# Patient Record
Sex: Female | Born: 1985 | Race: White | Hispanic: No | Marital: Married | Smoking: Never smoker
Health system: Southern US, Community
[De-identification: ages and names within clinical notes are randomized; demographics above are authoritative.]

## PROBLEM LIST (undated history)

## (undated) ENCOUNTER — Inpatient Hospital Stay (HOSPITAL_COMMUNITY): Payer: Self-pay

## (undated) DIAGNOSIS — R7989 Other specified abnormal findings of blood chemistry: Secondary | ICD-10-CM

## (undated) DIAGNOSIS — I499 Cardiac arrhythmia, unspecified: Secondary | ICD-10-CM

## (undated) DIAGNOSIS — O365990 Maternal care for other known or suspected poor fetal growth, unspecified trimester, not applicable or unspecified: Secondary | ICD-10-CM

## (undated) DIAGNOSIS — K219 Gastro-esophageal reflux disease without esophagitis: Secondary | ICD-10-CM

## (undated) HISTORY — DX: Gastro-esophageal reflux disease without esophagitis: K21.9

## (undated) HISTORY — DX: Cardiac arrhythmia, unspecified: I49.9

## (undated) HISTORY — PX: THYROIDECTOMY, PARTIAL: SHX18

## (undated) HISTORY — DX: Other specified abnormal findings of blood chemistry: R79.89

---

## 2007-08-27 ENCOUNTER — Encounter: Admission: RE | Admit: 2007-08-27 | Discharge: 2007-08-27 | Payer: Self-pay | Admitting: Internal Medicine

## 2008-07-16 ENCOUNTER — Emergency Department (HOSPITAL_COMMUNITY): Admission: EM | Admit: 2008-07-16 | Discharge: 2008-07-16 | Payer: Self-pay | Admitting: Emergency Medicine

## 2009-06-15 ENCOUNTER — Encounter: Payer: Self-pay | Admitting: Cardiology

## 2009-06-15 ENCOUNTER — Ambulatory Visit (HOSPITAL_COMMUNITY): Admission: RE | Admit: 2009-06-15 | Discharge: 2009-06-15 | Payer: Self-pay | Admitting: Otolaryngology

## 2009-06-19 ENCOUNTER — Ambulatory Visit: Payer: Self-pay | Admitting: Cardiology

## 2009-06-19 DIAGNOSIS — R002 Palpitations: Secondary | ICD-10-CM

## 2009-06-19 DIAGNOSIS — R9431 Abnormal electrocardiogram [ECG] [EKG]: Secondary | ICD-10-CM

## 2009-06-19 DIAGNOSIS — F411 Generalized anxiety disorder: Secondary | ICD-10-CM

## 2009-06-21 ENCOUNTER — Ambulatory Visit (HOSPITAL_COMMUNITY): Admission: RE | Admit: 2009-06-21 | Discharge: 2009-06-21 | Payer: Self-pay | Admitting: Otolaryngology

## 2009-06-21 ENCOUNTER — Encounter (INDEPENDENT_AMBULATORY_CARE_PROVIDER_SITE_OTHER): Payer: Self-pay | Admitting: Diagnostic Radiology

## 2009-07-12 ENCOUNTER — Ambulatory Visit: Payer: Self-pay

## 2009-07-12 ENCOUNTER — Ambulatory Visit: Payer: Self-pay | Admitting: Cardiology

## 2009-07-12 ENCOUNTER — Encounter: Payer: Self-pay | Admitting: Cardiology

## 2009-07-12 ENCOUNTER — Ambulatory Visit (HOSPITAL_COMMUNITY): Admission: RE | Admit: 2009-07-12 | Discharge: 2009-07-12 | Payer: Self-pay | Admitting: Cardiology

## 2009-07-18 ENCOUNTER — Telehealth: Payer: Self-pay | Admitting: Cardiology

## 2009-07-30 ENCOUNTER — Encounter: Payer: Self-pay | Admitting: Cardiology

## 2009-11-28 ENCOUNTER — Other Ambulatory Visit: Admission: RE | Admit: 2009-11-28 | Discharge: 2009-11-28 | Payer: Self-pay | Admitting: Otolaryngology

## 2009-12-05 ENCOUNTER — Ambulatory Visit (HOSPITAL_COMMUNITY): Admission: RE | Admit: 2009-12-05 | Discharge: 2009-12-05 | Payer: Self-pay | Admitting: Otolaryngology

## 2010-01-03 ENCOUNTER — Ambulatory Visit (HOSPITAL_COMMUNITY): Admission: RE | Admit: 2010-01-03 | Discharge: 2010-01-04 | Payer: Self-pay | Admitting: Otolaryngology

## 2010-01-03 ENCOUNTER — Encounter (INDEPENDENT_AMBULATORY_CARE_PROVIDER_SITE_OTHER): Payer: Self-pay | Admitting: Otolaryngology

## 2010-08-11 NOTE — L&D Delivery Note (Signed)
Delivery Note   C/C/+1 at 2050 Onset of active 2nd stage 2100 FWB Cat 1-2 on EFM Patient pushed lithotomy than L and R lateral  At 9:23 PM a viable female was delivered via Vaginal, Spontaneous Delivery by CNM (Presentation: Middle Occiput Anterior, OA to ROT, L compound hand).  APGAR: 9, 9; weight 6-9.   Placenta status: Intact, Spontaneous.  Cord: 3 vessels with the following complications: None.  Cord pH: not done. Cord double clamped after pulse cessation, cut by FOB.   Anesthesia: Epidural  Episiotomy: None Lacerations: 1st degree;Vaginal Suture Repair: 3.0 vicryl rapide Est. Blood Loss (mL): 300  Mom to postpartum.  Baby to mother.  Melissa Stark 08/03/2011, 9:46 PM

## 2010-09-01 ENCOUNTER — Encounter: Payer: Self-pay | Admitting: Otolaryngology

## 2010-09-10 NOTE — Procedures (Signed)
Summary: summary report  summary report   Imported By: Mirna Mires 11/22/2009 12:06:26  _____________________________________________________________________  External Attachment:    Type:   Image     Comment:   External Document

## 2010-10-28 LAB — CBC
HCT: 37.3 % (ref 36.0–46.0)
MCHC: 34.9 g/dL (ref 30.0–36.0)
RBC: 3.9 MIL/uL (ref 3.87–5.11)

## 2010-12-30 LAB — HEPATITIS B SURFACE ANTIGEN: Hepatitis B Surface Ag: NEGATIVE

## 2010-12-30 LAB — RPR: RPR: NONREACTIVE

## 2011-07-25 ENCOUNTER — Encounter (HOSPITAL_COMMUNITY): Payer: Self-pay | Admitting: *Deleted

## 2011-07-25 ENCOUNTER — Telehealth (HOSPITAL_COMMUNITY): Payer: Self-pay | Admitting: *Deleted

## 2011-07-25 NOTE — Telephone Encounter (Signed)
Preadmission screen  

## 2011-08-02 ENCOUNTER — Encounter (HOSPITAL_COMMUNITY): Payer: Self-pay | Admitting: Pharmacist

## 2011-08-02 ENCOUNTER — Encounter (HOSPITAL_COMMUNITY): Payer: Self-pay

## 2011-08-02 ENCOUNTER — Inpatient Hospital Stay (HOSPITAL_COMMUNITY)
Admission: AD | Admit: 2011-08-02 | Discharge: 2011-08-05 | DRG: 775 | Disposition: A | Discharge status: Home / Self Care | Payer: 59 | Source: Ambulatory Visit | Attending: Obstetrics and Gynecology | Admitting: Obstetrics and Gynecology

## 2011-08-02 DIAGNOSIS — O365990 Maternal care for other known or suspected poor fetal growth, unspecified trimester, not applicable or unspecified: Principal | ICD-10-CM

## 2011-08-02 HISTORY — DX: Maternal care for other known or suspected poor fetal growth, unspecified trimester, not applicable or unspecified: O36.5990

## 2011-08-02 LAB — CBC
Hemoglobin: 11 g/dL — ABNORMAL LOW (ref 12.0–15.0)
RBC: 3.33 MIL/uL — ABNORMAL LOW (ref 3.87–5.11)

## 2011-08-02 MED ORDER — LIDOCAINE HCL (PF) 1 % IJ SOLN
30.0000 mL | INTRAMUSCULAR | Status: DC | PRN
  Filled 2011-08-02: qty 30

## 2011-08-02 MED ORDER — OXYTOCIN 10 UNIT/ML IJ SOLN: 10.0000 [IU] | Freq: Once | INTRAMUSCULAR | Status: DC

## 2011-08-02 MED ORDER — ONDANSETRON HCL 4 MG/2ML IJ SOLN
4.0000 mg | Freq: Four times a day (QID) | INTRAMUSCULAR | Status: DC | PRN
  Administered 2011-08-03: 4 mg via INTRAVENOUS
  Filled 2011-08-02: qty 2

## 2011-08-02 MED ORDER — CITRIC ACID-SODIUM CITRATE 334-500 MG/5ML PO SOLN: 30.0000 mL | ORAL | Status: DC | PRN

## 2011-08-02 MED ORDER — OXYCODONE-ACETAMINOPHEN 5-325 MG PO TABS: 2.0000 | ORAL_TABLET | ORAL | Status: DC | PRN

## 2011-08-02 MED ORDER — OXYTOCIN 20 UNITS IN LACTATED RINGERS INFUSION - SIMPLE
1.0000 m[IU]/min | INTRAVENOUS | Status: DC
  Administered 2011-08-03: 2 m[IU]/min via INTRAVENOUS
  Filled 2011-08-02: qty 1000

## 2011-08-02 MED ORDER — LACTATED RINGERS IV SOLN
500.0000 mL | INTRAVENOUS | Status: DC | PRN
  Administered 2011-08-03 (×2): 500 mL via INTRAVENOUS

## 2011-08-02 MED ORDER — ACETAMINOPHEN 325 MG PO TABS: 650.0000 mg | ORAL_TABLET | ORAL | Status: DC | PRN

## 2011-08-02 MED ORDER — NALBUPHINE SYRINGE 5 MG/0.5 ML
5.0000 mg | INJECTION | INTRAMUSCULAR | Status: DC | PRN
  Administered 2011-08-03: 5 mg via INTRAVENOUS
  Filled 2011-08-02: qty 1

## 2011-08-02 MED ORDER — OXYTOCIN 20 UNITS IN LACTATED RINGERS INFUSION - SIMPLE: 125.0000 mL/h | Freq: Once | INTRAVENOUS | Status: DC

## 2011-08-02 MED ORDER — FLEET ENEMA 7-19 GM/118ML RE ENEM: 1.0000 | ENEMA | RECTAL | Status: DC | PRN

## 2011-08-02 MED ORDER — LACTATED RINGERS IV SOLN
INTRAVENOUS | Status: DC
  Administered 2011-08-03 (×2): via INTRAVENOUS

## 2011-08-02 MED ORDER — DINOPROSTONE 10 MG VA INST
10.0000 mg | VAGINAL_INSERT | Freq: Once | VAGINAL | Status: AC
  Administered 2011-08-02: 10 mg via VAGINAL
  Filled 2011-08-02: qty 1

## 2011-08-02 MED ORDER — FAMOTIDINE 20 MG PO TABS
20.0000 mg | ORAL_TABLET | Freq: Every day | ORAL | Status: DC
  Filled 2011-08-02: qty 1

## 2011-08-02 MED ORDER — IBUPROFEN 600 MG PO TABS: 600.0000 mg | ORAL_TABLET | Freq: Four times a day (QID) | ORAL | Status: DC | PRN

## 2011-08-02 MED ORDER — OXYTOCIN BOLUS FROM INFUSION
500.0000 mL | Freq: Once | INTRAVENOUS | Status: AC
  Administered 2011-08-03: 500 mL via INTRAVENOUS
  Filled 2011-08-02: qty 500

## 2011-08-02 MED ORDER — ZOLPIDEM TARTRATE 10 MG PO TABS
10.0000 mg | ORAL_TABLET | Freq: Every evening | ORAL | Status: DC | PRN
  Administered 2011-08-02: 10 mg via ORAL
  Filled 2011-08-02: qty 1

## 2011-08-02 MED ORDER — TERBUTALINE SULFATE 1 MG/ML IJ SOLN: 0.2500 mg | Freq: Once | INTRAMUSCULAR | Status: AC | PRN

## 2011-08-02 NOTE — H&P (Signed)
  Melissa Stark is a 25 y.o. G1P0 at [redacted]w[redacted]d presenting for IOL 2/ developing asymmetric IUGR diagnosed at 37 wks, stable AT bi-weekly since w/ nl AF and reactive NST's. Phone consult w/ Dr. Sherrie George (MFM) last week, recc IOL at 39 wks.  Pt notes no contractions since membrane strip yesterday . Good fetal movement, No vaginal bleeding, no leaking fluid noted.  Prenatal Course Source of Care: [facility], midwife patient  with onset of care at 7 weeks Pregnancy complications or risk:  developing asymmetric IUGR,  Rh negative status (rhogam administered at 28 wks) Hx hemi-thyroidectomy (folicular adenoma), nl TFT's during pregnancy  Current medications: Zantac, PNV, red raspberry leaf tea, blue cohosh, evening primrose oil, cal-mag supplements  OB History    Grav Para Term Preterm Abortions TAB SAB Ect Mult Living   1              Past Medical History  Diagnosis Date  . Dysrhythmia     hx of tachycardia  . developing asymmetric IUGR - for IOL 08/02/2011   Past Surgical History  Procedure Date  . Thyroidectomy, partial    Family History: family history includes COPD in her paternal grandfather; Cancer in her mother; Diabetes in her maternal grandfather and paternal grandfather; Heart disease in her maternal grandfather and paternal grandfather; Hypertension in her paternal grandfather and paternal grandmother; Kidney disease in her maternal grandfather; and Peripheral vascular disease in her paternal grandmother. Social History:  reports that she has never smoked. She has never used smokeless tobacco. She reports that she does not drink alcohol or use illicit drugs.  Review of Systems - negative   Physical Exam: Blood pressure 123/77, pulse 105, temperature 97.3 F (36.3 C), temperature source Oral, resp. rate 18, height 5\' 9"  (1.753 m), weight 87.091 kg (192 lb). General: NAD Heart: RRR, no murmurs Lungs: CTA b/l  Abd: Soft, NT, EFW 6lbs  Ext: no edema Neuro: DTRs normal   Membranes:  intact Vaginal bleeding: none Speculum Exam: n/a Digital Cervical Exam: 2/60/-1  Presentation vertex FHR:  Baseline rate 130   Variability mod  Accelerations present  Decelerations none Contractions: Frequency occasional / mild  Pertinent Labs/Studies:  CBC and RPR pending  Prenatal labs: ABO, Rh: O/Negative/-- (05/21 0000) Antibody: Negative (05/21 0000) Rubella:  immune RPR: Nonreactive (05/21 0000)  HBsAg: Negative (05/21 0000)  HIV: Non-reactive (05/21 0000)  GBS: Negative (11/30 0000)  1 hr Glucola 126  Genetic screening wnl Ultrasound: Weeks 37 Result EFW 5-10 (20%) with AC at 0%, nl AFI, nl dopplers  Assessment: 25 y.o. G1P0 at [redacted]w[redacted]d, developing asymmetric IUGR  1. Term IUP, favorable Bishop's 2. FWB cat 1  Plan:  1. Admit to BS for cervidil IOL, Pitocin in AM 2. Routine L&D orders 3. Analgesia/anesthesia PRN     Consultant: Dr. Billy Coast updated    [facility] 08/02/2011, 9:05 PM

## 2011-08-03 ENCOUNTER — Inpatient Hospital Stay (HOSPITAL_COMMUNITY): Admission: RE | Admit: 2011-08-03 | Payer: Self-pay | Source: Ambulatory Visit

## 2011-08-03 ENCOUNTER — Encounter (HOSPITAL_COMMUNITY): Payer: Self-pay

## 2011-08-03 ENCOUNTER — Inpatient Hospital Stay (HOSPITAL_COMMUNITY): Payer: 59 | Admitting: Anesthesiology

## 2011-08-03 ENCOUNTER — Other Ambulatory Visit: Payer: Self-pay | Admitting: Obstetrics and Gynecology

## 2011-08-03 ENCOUNTER — Encounter (HOSPITAL_COMMUNITY): Payer: Self-pay | Admitting: Anesthesiology

## 2011-08-03 LAB — RPR: RPR Ser Ql: NONREACTIVE

## 2011-08-03 MED ORDER — WITCH HAZEL-GLYCERIN EX PADS: 1.0000 "application " | MEDICATED_PAD | CUTANEOUS | Status: DC | PRN

## 2011-08-03 MED ORDER — BENZOCAINE-MENTHOL 20-0.5 % EX AERO
1.0000 "application " | INHALATION_SPRAY | CUTANEOUS | Status: DC | PRN
  Administered 2011-08-04: 1 via TOPICAL

## 2011-08-03 MED ORDER — LACTATED RINGERS IV SOLN: 500.0000 mL | Freq: Once | INTRAVENOUS | Status: DC

## 2011-08-03 MED ORDER — EPHEDRINE 5 MG/ML INJ
10.0000 mg | INTRAVENOUS | Status: DC | PRN
  Filled 2011-08-03: qty 4

## 2011-08-03 MED ORDER — PHENYLEPHRINE 40 MCG/ML (10ML) SYRINGE FOR IV PUSH (FOR BLOOD PRESSURE SUPPORT): 80.0000 ug | PREFILLED_SYRINGE | INTRAVENOUS | Status: DC | PRN

## 2011-08-03 MED ORDER — EPHEDRINE 5 MG/ML INJ: 10.0000 mg | INTRAVENOUS | Status: DC | PRN

## 2011-08-03 MED ORDER — DIPHENHYDRAMINE HCL 25 MG PO CAPS: 25.0000 mg | ORAL_CAPSULE | Freq: Four times a day (QID) | ORAL | Status: DC | PRN

## 2011-08-03 MED ORDER — LANOLIN HYDROUS EX OINT: TOPICAL_OINTMENT | CUTANEOUS | Status: DC | PRN

## 2011-08-03 MED ORDER — PRENATAL MULTIVITAMIN CH
1.0000 | ORAL_TABLET | Freq: Every day | ORAL | Status: DC
  Administered 2011-08-04 – 2011-08-05 (×2): 1 via ORAL
  Filled 2011-08-03 (×2): qty 1

## 2011-08-03 MED ORDER — FLEET ENEMA 7-19 GM/118ML RE ENEM: 1.0000 | ENEMA | Freq: Every day | RECTAL | Status: DC | PRN

## 2011-08-03 MED ORDER — OXYCODONE-ACETAMINOPHEN 5-325 MG PO TABS
1.0000 | ORAL_TABLET | ORAL | Status: DC | PRN
  Administered 2011-08-04 – 2011-08-05 (×6): 1 via ORAL
  Filled 2011-08-03 (×6): qty 1

## 2011-08-03 MED ORDER — LIDOCAINE HCL 1.5 % IJ SOLN
INTRAMUSCULAR | Status: DC | PRN
  Administered 2011-08-03 (×2): 5 mL via EPIDURAL

## 2011-08-03 MED ORDER — BISACODYL 10 MG RE SUPP: 10.0000 mg | Freq: Every day | RECTAL | Status: DC | PRN

## 2011-08-03 MED ORDER — OXYTOCIN 20 UNITS IN LACTATED RINGERS INFUSION - SIMPLE: 1.0000 m[IU]/min | INTRAVENOUS | Status: DC

## 2011-08-03 MED ORDER — ZOLPIDEM TARTRATE 5 MG PO TABS: 5.0000 mg | ORAL_TABLET | Freq: Every evening | ORAL | Status: DC | PRN

## 2011-08-03 MED ORDER — IBUPROFEN 600 MG PO TABS
600.0000 mg | ORAL_TABLET | Freq: Four times a day (QID) | ORAL | Status: DC
  Administered 2011-08-04 – 2011-08-05 (×7): 600 mg via ORAL
  Filled 2011-08-03 (×7): qty 1

## 2011-08-03 MED ORDER — ONDANSETRON HCL 4 MG/2ML IJ SOLN: 4.0000 mg | INTRAMUSCULAR | Status: DC | PRN

## 2011-08-03 MED ORDER — SENNOSIDES-DOCUSATE SODIUM 8.6-50 MG PO TABS
2.0000 | ORAL_TABLET | Freq: Every day | ORAL | Status: DC
  Administered 2011-08-04: 2 via ORAL

## 2011-08-03 MED ORDER — ONDANSETRON HCL 4 MG PO TABS: 4.0000 mg | ORAL_TABLET | ORAL | Status: DC | PRN

## 2011-08-03 MED ORDER — DIBUCAINE 1 % RE OINT: 1.0000 "application " | TOPICAL_OINTMENT | RECTAL | Status: DC | PRN

## 2011-08-03 MED ORDER — FENTANYL 2.5 MCG/ML BUPIVACAINE 1/10 % EPIDURAL INFUSION (WH - ANES): 14.0000 mL/h | INTRAMUSCULAR | Status: DC

## 2011-08-03 MED ORDER — DIPHENHYDRAMINE HCL 50 MG/ML IJ SOLN: 12.5000 mg | INTRAMUSCULAR | Status: DC | PRN

## 2011-08-03 MED ORDER — TETANUS-DIPHTH-ACELL PERTUSSIS 5-2.5-18.5 LF-MCG/0.5 IM SUSP: 0.5000 mL | Freq: Once | INTRAMUSCULAR | Status: DC

## 2011-08-03 MED ORDER — FENTANYL 2.5 MCG/ML BUPIVACAINE 1/10 % EPIDURAL INFUSION (WH - ANES)
14.0000 mL/h | INTRAMUSCULAR | Status: DC
  Administered 2011-08-03: 14 mL/h via EPIDURAL
  Administered 2011-08-03: 12 mL/h via EPIDURAL
  Filled 2011-08-03 (×2): qty 60

## 2011-08-03 MED ORDER — PHENYLEPHRINE 40 MCG/ML (10ML) SYRINGE FOR IV PUSH (FOR BLOOD PRESSURE SUPPORT)
80.0000 ug | PREFILLED_SYRINGE | INTRAVENOUS | Status: DC | PRN
  Filled 2011-08-03: qty 5

## 2011-08-03 MED ORDER — LACTATED RINGERS IV BOLUS (SEPSIS): 1000.0000 mL | Freq: Once | INTRAVENOUS | Status: DC

## 2011-08-03 MED ORDER — SIMETHICONE 80 MG PO CHEW: 80.0000 mg | CHEWABLE_TABLET | ORAL | Status: DC | PRN

## 2011-08-03 NOTE — Anesthesia Procedure Notes (Signed)
Epidural Patient location during procedure: OB Start time: 08/03/2011 5:48 PM  Staffing Anesthesiologist: Brayton Caves R Performed by: anesthesiologist   Preanesthetic Checklist Completed: patient identified, site marked, surgical consent, pre-op evaluation, timeout performed, IV checked, risks and benefits discussed and monitors and equipment checked  Epidural Patient position: sitting Prep: site prepped and draped and DuraPrep Patient monitoring: continuous pulse ox and blood pressure Approach: midline Injection technique: LOR air and LOR saline  Needle:  Needle type: Tuohy  Needle gauge: 17 G Needle length: 9 cm Needle insertion depth: 5 cm cm Catheter type: closed end flexible Catheter size: 19 Gauge Catheter at skin depth: 10 cm Test dose: negative  Assessment Events: blood not aspirated, injection not painful, no injection resistance, negative IV test and no paresthesia  Additional Notes Patient identified.  Risk benefits discussed including failed block, incomplete pain control, headache, nerve damage, paralysis, blood pressure changes, nausea, vomiting, reactions to medication both toxic or allergic, and postpartum back pain.  Patient expressed understanding and wished to proceed.  All questions were answered.  Sterile technique used throughout procedure and epidural site dressed with sterile barrier dressing. No paresthesia or other complications noted.The patient did not experience any signs of intravascular injection such as tinnitus or metallic taste in mouth nor signs of intrathecal spread such as rapid motor block. Please see nursing notes for vital signs.

## 2011-08-03 NOTE — Progress Notes (Signed)
Reviewed Strip c D. Renae Fickle, CNM.  Discussed change in FHR BL & UA.

## 2011-08-03 NOTE — Progress Notes (Signed)
S: Doing well, pain well controlled with  Epidural, (+) pelvic pressure.   O: BP 125/88  Pulse 150  Temp(Src) 97.5 F (36.4 C) (Oral)  Resp 18  Ht 5\' 9"  (1.753 m)  Wt 87.091 kg (192 lb)  BMI 28.35 kg/m2  SpO2 100%   FHT:  FHR: 120 bpm, variability: moderate,  accelerations:  Present,  decelerations:  Present occasional variables UC:   regular, every 2-3 minutes SVE:   Dilation: 10 Effacement (%): 90 Station: +1 Exam by:: Colon Flattery, CNM   A / P: C/C, start active second stage, CNM at [facility] for support  Fetal Wellbeing:  Category I Pain Control:  Epidural  Anticipated MOD:  NSVD  Melissa Stark 08/03/2011, 8:55 PM

## 2011-08-03 NOTE — Progress Notes (Signed)
S: Doing well, pain minimal cramping, plan  Epidural for active labor. Pitocin at 10 mu/min   O: BP 124/83  Pulse 99  Temp(Src) 98.4 F (36.9 C) (Oral)  Resp 18  Ht 5\' 9"  (1.753 m)  Wt 87.091 kg (192 lb)  BMI 28.35 kg/m2   FHT:  FHR: 120 bpm, variability: moderate,  accelerations:  Present,  decelerations:  Absent UC:   regular, every 2-3 minutes SVE:   Dilation: 2 Effacement (%): 80 Station: -1 Exam by:: d. Renae Fickle CNM   AROM, clear AF  A / P: Induction of labor due to IUGR,  progressing well on pitocin  Fetal Wellbeing:  Category I Pain Control:  Labor support without medications and IV narcotic PRN, epidural in active labor  Anticipated MOD:  NSVD  Melissa Stark 08/03/2011, 12:12 PM

## 2011-08-03 NOTE — Progress Notes (Signed)
Colon Flattery, CNM stated she would like MVUs to be a minimum of 200, preferably 250-300

## 2011-08-03 NOTE — Progress Notes (Signed)
S: S/P epidural, pain relieved, feels mostly pressure, tired  O: BP 114/80  Pulse 107  Temp(Src) 98.2 F (36.8 C) (Oral)  Resp 18  Ht 5\' 9"  (1.753 m)  Wt 87.091 kg (192 lb)  BMI 28.35 kg/m2  SpO2 99%   FHT:  FHR: 120 bpm, variability: moderate,  accelerations:  Present,  decelerations:  Present mild variable x 1 with foley cath placement UC:   IUPC placed, ctx q 1-2 min, 40-50 mm pressure, lasting 50-60 sec SVE:   Dilation: 4-5 Effacement (%): 90 Station: -1 Exam by:: Melissa Stark, CNM   A / P: IOL at term, IUGR protracted latent phase with inadequate ctx. and dystotic pattern. Will decrease Pitocin by half to 10 mu/min and restart increasing by 2 mu q 30 min to reestablish adequate pattern LR bolus 1L Fetal Wellbeing:  Category I Pain Control:  Epidural  Anticipated MOD:  NSVD  Melissa Stark,Melissa Stark 08/03/2011, 6:52 PM

## 2011-08-03 NOTE — Progress Notes (Signed)
SVE's will be done by CNM.  May give Nubain without SVE if pt requests it for pain.  Arlan Organ, CNM will remain in-house.

## 2011-08-03 NOTE — Anesthesia Preprocedure Evaluation (Signed)
Anesthesia Evaluation  Patient identified by MRN, date of birth, ID band Patient awake    Reviewed: Allergy & Precautions, H&P , Patient's Chart, lab work & pertinent test results  Airway Mallampati: II TM Distance: >3 FB Neck ROM: full    Dental No notable dental hx.    Pulmonary neg pulmonary ROS,  clear to auscultation  Pulmonary exam normal       Cardiovascular neg cardio ROS + dysrhythmias regular Normal    Neuro/Psych Negative Neurological ROS  Negative Psych ROS   GI/Hepatic negative GI ROS, Neg liver ROS,   Endo/Other  Negative Endocrine ROS  Renal/GU negative Renal ROS     Musculoskeletal   Abdominal   Peds  Hematology negative hematology ROS (+)   Anesthesia Other Findings   Reproductive/Obstetrics (+) Pregnancy                           Anesthesia Physical Anesthesia Plan  ASA: II  Anesthesia Plan: Epidural   Post-op Pain Management:    Induction:   Airway Management Planned:   Additional Equipment:   Intra-op Plan:   Post-operative Plan:   Informed Consent: I have reviewed the patients History and Physical, chart, labs and discussed the procedure including the risks, benefits and alternatives for the proposed anesthesia with the patient or authorized representative who has indicated his/her understanding and acceptance.     Plan Discussed with:   Anesthesia Plan Comments:         Anesthesia Quick Evaluation

## 2011-08-03 NOTE — Progress Notes (Signed)
S: Breathing w/ ctx, s/p IV Nubain with minimal relief, labored in various pos - birthing ball, ambulated, now resting in bed, spouse and mother supportive at [facility]. Desires epidural now. Pitocin @ 18 mu/min  O: BP 125/79  Pulse 105  Temp(Src) 98.2 F (36.8 C) (Oral)  Resp 18  Ht 5\' 9"  (1.753 m)  Wt 87.091 kg (192 lb)  BMI 28.35 kg/m2   FHT:  FHR: 125 bpm, variability: moderate,  accelerations:  Present,  decelerations:  Absent UC:   regular, every 2-3 minutes SVE:   Dilation: 4 Effacement (%): 80 Station: -1 Exam by:: Arlan Organ, CNM   A / P: Protracted latent phase, starting to make noticeable change, expect active labor soon, will place IUPC after epidural effective.   Fetal Wellbeing:  Category I Pain Control:  Epidural now, continue on Pitocin  Anticipated MOD:  NSVD  Mariona Scholes 08/03/2011, 4:50 PM

## 2011-08-03 NOTE — Progress Notes (Signed)
Vomited 150cc of clear/white emesis -- BP WNL, denies feeling light-headed.

## 2011-08-03 NOTE — Progress Notes (Signed)
Melissa Stark is a 25 y.o. G1P0 at [redacted]w[redacted]d by ultrasound admitted for induction of labor due to developing asymmetric IUGR.  Subjective: Cervidil removed at 0630, patient slept during night with Ambien, showered this AM. Feels well. Good FM.   Objective: BP 123/77  Pulse 105  Temp(Src) 97.3 F (36.3 C) (Oral)  Resp 18  Ht 5\' 9"  (1.753 m)  Wt 87.091 kg (192 lb)  BMI 28.35 kg/m2      FHT:  FHR: 125 bpm, variability: moderate,  accelerations:  Present,  decelerations:  Absent UC:   irregular, every 3-6 minutes SVE:   Dilation: 2 Effacement (%): 80 Station: -1 Exam by:: d. Renae Fickle CNM  Membrane strip, (+) show on glove, vertex Labs: Lab Results  Component Value Date   WBC 12.0* 08/02/2011   HGB 11.0* 08/02/2011   HCT 31.8* 08/02/2011   MCV 95.5 08/02/2011   PLT 190 08/02/2011    Assessment / Plan: Induction of labor due to IUGR, s/p Cervidil overnight.  Labor: Plan start Pitocin ASAP Preeclampsia:  n/a Fetal Wellbeing:  Category I Pain Control:  Labor support without medications and IV narcotics PRN I/D:  GBS neg Anticipated MOD:  NSVD  Melissa Stark 08/03/2011, 8:16 AM

## 2011-08-04 ENCOUNTER — Encounter (HOSPITAL_COMMUNITY): Payer: Self-pay

## 2011-08-04 LAB — CBC
HCT: 30.1 % — ABNORMAL LOW (ref 36.0–46.0)
Hemoglobin: 10.3 g/dL — ABNORMAL LOW (ref 12.0–15.0)
MCHC: 34.2 g/dL (ref 30.0–36.0)
RDW: 12.5 % (ref 11.5–15.5)
WBC: 14.6 10*3/uL — ABNORMAL HIGH (ref 4.0–10.5)

## 2011-08-04 MED ORDER — BENZOCAINE-MENTHOL 20-0.5 % EX AERO
INHALATION_SPRAY | CUTANEOUS | Status: AC
  Filled 2011-08-04: qty 56

## 2011-08-04 MED ORDER — RHO D IMMUNE GLOBULIN 1500 UNIT/2ML IJ SOLN
300.0000 ug | Freq: Once | INTRAMUSCULAR | Status: AC
  Administered 2011-08-04: 300 ug via INTRAMUSCULAR
  Filled 2011-08-04: qty 2

## 2011-08-04 NOTE — Progress Notes (Signed)
PPD 1 SVD  S:  Reports feeling tired, infant breast fed through the night             Tolerating po/ No nausea or vomiting             Bleeding is light             Pain moderately controlled with Motrin and percocet.             Up ad lib / ambulatory / voiding  Newborn  Information for the patient's newborn:  Meggen, Spaziani [[id number]]  female  breast feeding    O:  A & O x 3 NAD, tired appearing             VS: Blood pressure 125/91, pulse 102, temperature 98.3 F (36.8 C), temperature source Oral, resp. rate 20, height 5\' 9"  (1.753 m), weight 87.091 kg (192 lb), SpO2 100.00%, unknown if currently breastfeeding.  LABS:  Basename 08/04/11 0520 08/02/11 2115  HGB 10.3* 11.0*  HCT 30.1* 31.8*    I&O: I/O last 3 completed shifts: In: -  Out: 700 [Urine:400; Blood:300]      Lungs: Clear and unlabored  Heart: regular rate and rhythm / no mumurs  Abdomen: soft, non-tender, non-distended              Fundus: firm, non-tender, U -2  Perineum: intact repair, no edema  Lochia: small  Extremities: no edema, no calf pain or tenderness, neg Homans    A/P: PPD # 1 25 y.o., G1P1001   Principal Problem:  *PP care, s/p SVD 12/24   Doing well - stable status  Routine post partum orders  Lactation consult today  Anticipate discharge home in AM.   PAUL,DANIELA, CNM, MSN 08/04/2011, 8:47 AM

## 2011-08-04 NOTE — Anesthesia Postprocedure Evaluation (Signed)
Anesthesia Post Note  Patient: Melissa Stark  Procedure(s) Performed: * No procedures listed *  Anesthesia type: Epidural  Patient location: Mother/Baby  Post pain: Pain level controlled  Post assessment: Post-op Vital signs reviewed  Last Vitals:  Filed Vitals:   08/04/11 0500  BP: 125/91  Pulse: 102  Temp: 36.8 C  Resp: 20    Post vital signs: Reviewed  Level of consciousness: awake  Complications: No apparent anesthesia complications

## 2011-08-05 LAB — RH IG WORKUP (INCLUDES ABO/RH)
ABO/RH(D): O NEG
Gestational Age(Wks): 39

## 2011-08-05 MED ORDER — IBUPROFEN 600 MG PO TABS: 600.0000 mg | ORAL_TABLET | Freq: Four times a day (QID) | ORAL | Status: AC

## 2011-08-05 MED ORDER — OXYCODONE-ACETAMINOPHEN 5-325 MG PO TABS: 1.0000 | ORAL_TABLET | ORAL | Status: AC | PRN

## 2011-08-05 NOTE — Progress Notes (Signed)
Post Partum Day #2            Information for the patient's newborn:  Floriene, Jeschke [[id number]]  female   Feeding: breast  Subjective: No HA, SOB, CP, F/C, breast symptoms. Pain cramping with feeds, on motrin and occasional percocet. Normal vaginal bleeding, no clots.      Objective:  Temp:  [98.1 F (36.7 C)-98.9 F (37.2 C)] 98.1 F (36.7 C) (12/25 0656) Pulse Rate:  [91-109] 91  (12/25 0656) Resp:  [18-20] 18  (12/25 0656) BP: (110-120)/(72-78) 117/78 mmHg (12/25 0656)  No intake or output data in the 24 hours ending 08/05/11 0813     Basename 08/04/11 0520 08/02/11 2115  WBC 14.6* 12.0*  HGB 10.3* 11.0*  HCT 30.1* 31.8*  PLT 174 190    Blood type: --/--/O NEG (12/24 1454)/ infant Rh positive Rubella: Immune (05/21 0000)    Physical Exam:  General: alert, cooperative and no distress Uterine Fundus: firm Lochia: appropriate Perineum: repair intact, edema none DVT Evaluation: No evidence of DVT seen on physical exam. No significant calf/ankle edema.    Assessment/Plan: PPD # 2 / 25 y.o., G1P1001 S/P:induced vaginal   Principal Problem:  *PP care, s/p SVD 12/24 Rh neg - Rhogam given   normal postpartum exam  Continue current postpartum care  D/C home   LOS: 3 days   PAUL,DANIELA, CNM, MSN 08/05/2011, 8:13 AM

## 2011-08-05 NOTE — Discharge Summary (Signed)
Obstetric Discharge Summary Reason for Admission: induction of labor, asymmetric IUGR, term pregnancy Prenatal Procedures: NST and ultrasound Intrapartum Procedures: spontaneous vaginal delivery Postpartum Procedures: Rho(D) Ig and TDaP vaccine Complications-Operative and Postpartum: 1st degree vaginal laceration Hemoglobin  Date Value Range Status  08/04/2011 10.3* 12.0-15.0 (g/dL) Final     HCT  Date Value Range Status  08/04/2011 30.1* 36.0-46.0 (%) Final    Discharge Diagnoses: Term Pregnancy-delivered  Discharge Information: Date: 08/05/2011 Activity: pelvic rest Diet: routine Medications: PNV, Ibuprofen and Percocet Condition: stable Instructions: refer to practice specific booklet Discharge to: home Follow-up Information    Follow up with PAUL,DANIELA, CNM in 6 weeks.   Contact information:   [address] [address] [phone]          Newborn Data: Live born female "Melissa Stark" Birth Weight: 6 lb 9.8 oz (3000 g) APGAR: 9, 9  Home with mother.  PAUL,DANIELA 08/05/2011, 8:18 AM

## 2014-01-24 LAB — OB RESULTS CONSOLE ABO/RH: RH TYPE: NEGATIVE

## 2014-01-24 LAB — OB RESULTS CONSOLE ANTIBODY SCREEN: Antibody Screen: NEGATIVE

## 2014-01-24 LAB — OB RESULTS CONSOLE RPR: RPR: NONREACTIVE

## 2014-01-24 LAB — OB RESULTS CONSOLE HIV ANTIBODY (ROUTINE TESTING): HIV: NONREACTIVE

## 2014-01-24 LAB — OB RESULTS CONSOLE HEPATITIS B SURFACE ANTIGEN: Hepatitis B Surface Ag: NEGATIVE

## 2014-02-13 LAB — OB RESULTS CONSOLE GC/CHLAMYDIA
CHLAMYDIA, DNA PROBE: NEGATIVE
Gonorrhea: NEGATIVE

## 2014-06-12 ENCOUNTER — Encounter (HOSPITAL_COMMUNITY): Payer: Self-pay

## 2014-08-02 LAB — OB RESULTS CONSOLE GBS: STREP GROUP B AG: NEGATIVE

## 2014-08-11 NOTE — L&D Delivery Note (Signed)
Delivery Note At 6:42 PM a viable and healthy female was delivered via  (Presentation:OA).  APGAR: 8, 9; weight  -pending  Placenta status: complete, spontaneous.  Cord:  with the following complications:-NONE .  Cord pH: n/a  Anesthesia:  Epidural  Episiotomy:  None  Lacerations:  None  Est. Blood Loss (mL):  300 cc  Mom to postpartum.  Baby to Couplet care / Skin to Skin.  Lashun Ramseyer R 08/31/2014, 7:08 PM

## 2014-08-18 ENCOUNTER — Encounter (HOSPITAL_COMMUNITY): Payer: Self-pay | Admitting: *Deleted

## 2014-08-18 ENCOUNTER — Telehealth (HOSPITAL_COMMUNITY): Payer: Self-pay | Admitting: *Deleted

## 2014-08-18 NOTE — Telephone Encounter (Signed)
Preadmission screen  

## 2014-08-21 ENCOUNTER — Observation Stay (HOSPITAL_COMMUNITY)
Admission: RE | Admit: 2014-08-21 | Discharge: 2014-08-21 | Disposition: A | Discharge status: Home / Self Care | Payer: 59 | Source: Ambulatory Visit | Attending: Obstetrics | Admitting: Obstetrics

## 2014-08-21 ENCOUNTER — Encounter (HOSPITAL_COMMUNITY): Payer: Self-pay

## 2014-08-21 DIAGNOSIS — Z3A38 38 weeks gestation of pregnancy: Secondary | ICD-10-CM | POA: Diagnosis not present

## 2014-08-21 DIAGNOSIS — O321XX0 Maternal care for breech presentation, not applicable or unspecified: Principal | ICD-10-CM | POA: Insufficient documentation

## 2014-08-21 DIAGNOSIS — K219 Gastro-esophageal reflux disease without esophagitis: Secondary | ICD-10-CM | POA: Insufficient documentation

## 2014-08-21 NOTE — H&P (Signed)
Melissa Stark is a  HPI: 29 yo G2P1 at 38'3 with prior vaginal delivery who was found to be complete breech at office visit last wk. Given prior SVD and favorable u/s (nl AFI, AGA, post placenta, complete breech) decision made for attempted version. Pt notes very active FM with frequent 'flipping', no VB, no LOF, no ctx.   [facility] at [facility] since 7 wks - dated by 7 wk u/s NCWD - h/o infertiilty- Clomid conception - increased N/V with need to Diclegis and Zofran throughout pregnancy - Rh neg, s/p Rhogam - h/o hemithyroidectomy, euthyroid w/o meds - b/l CPC, nl quad - marginal CI, nl growth - tachycardia, w/ episodes of SOB. S/p cards eval in past- benign tachycardia. Pt with stable bp and O2 sats throughout preg but tachycardia up to the 120's. Anxiety likely a factor. Pt declined cards referral adn SSRI - growth. 37'6. Complete breech, 7'0, 60%, AFI 16, post placenta   Prenatal Transfer Tool  Maternal Diabetes: No Genetic Screening: Normal Maternal Ultrasounds/Referrals: Normal Fetal Ultrasounds or other Referrals:  None Maternal Substance Abuse:  No Significant Maternal Medications:  None Significant Maternal Lab Results: None     OB History    Gravida Para Term Preterm AB TAB SAB Ectopic Multiple Living   2 1 1       1      Past Medical History  Diagnosis Date  . Dysrhythmia     hx of tachycardia  . 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 PP care, s/p SVD 12/24 08/04/2011  . GERD (gastroesophageal reflux disease)    Past Surgical History  Procedure Laterality Date  . Thyroidectomy, partial     Family History: family history includes COPD in her paternal grandfather; Cancer in her mother; Diabetes in her maternal grandfather and paternal grandfather; Heart disease in her maternal grandfather and paternal grandfather; Hypertension in her paternal grandfather and paternal grandmother; Kidney disease in her maternal grandfather; Peripheral vascular  disease in her paternal grandmother. Social History:  reports that she has never smoked. She has never used smokeless tobacco. She reports that she does not drink alcohol or use illicit drugs.  Review of Systems - Negative except active FM   Dilation: 2.5 Effacement (%): 20 Station: Ballotable Exam by:: Hodaya Curto Blood pressure 116/73, pulse 90, temperature 97.7 F (36.5 C), temperature source Oral, resp. rate 20, height 5\' 9"  (1.753 m), weight 90.719 kg (200 lb), last menstrual period 11/29/2013, unknown if currently breastfeeding.  Physical Exam:  Gen: well appearing, no distress  Back: no CVAT Abd: gravid, NT, no RUQ pain LE: no edema, equal bilaterally, non-tender Toco: rare FH: baseline 130s, accelerations present, no deceleratons, 10 beat variability  Prenatal labs: ABO, Rh: O/Negative/-- (06/16 0000) Antibody: Negative (06/16 0000) Rubella:   immune RPR: Nonreactive (06/16 0000)  HBsAg: Negative (06/16 0000)  HIV: Non-reactive (06/16 0000)  GBS: Negative (12/23 0000)  1 hr Glucola 127  Genetic screening nl quad Anatomy US b/l CPC cysts, marginal cord insertion   Assessment/Plan: 29 y.o. G2P1001 at 69110w3d With breech on prior u/s. For version today. Reactive fetal testing notes, cvx exam done, bedside u/s shows vtx presentation. Version cancelled, plan office f/u.    Decklyn Hyder A. 08/21/2014, 8:45 AM

## 2014-08-21 NOTE — Discharge Instructions (Signed)
Keep MD appt on Thursday unless signs/symptoms of labor or rupture of membranes prior to that date.

## 2014-08-25 ENCOUNTER — Other Ambulatory Visit: Payer: Self-pay | Admitting: Obstetrics

## 2014-08-30 ENCOUNTER — Other Ambulatory Visit: Payer: Self-pay | Admitting: Obstetrics & Gynecology

## 2014-08-31 ENCOUNTER — Encounter (HOSPITAL_COMMUNITY): Payer: Self-pay

## 2014-08-31 ENCOUNTER — Inpatient Hospital Stay (HOSPITAL_COMMUNITY): Payer: 59 | Admitting: Anesthesiology

## 2014-08-31 ENCOUNTER — Inpatient Hospital Stay (HOSPITAL_COMMUNITY)
Admission: RE | Admit: 2014-08-31 | Discharge: 2014-09-01 | DRG: 775 | Disposition: A | Discharge status: Home / Self Care | Payer: 59 | Source: Ambulatory Visit | Attending: Obstetrics & Gynecology | Admitting: Obstetrics & Gynecology

## 2014-08-31 DIAGNOSIS — D62 Acute posthemorrhagic anemia: Secondary | ICD-10-CM | POA: Diagnosis present

## 2014-08-31 DIAGNOSIS — Z3A39 39 weeks gestation of pregnancy: Secondary | ICD-10-CM | POA: Diagnosis present

## 2014-08-31 DIAGNOSIS — O9081 Anemia of the puerperium: Secondary | ICD-10-CM | POA: Diagnosis not present

## 2014-08-31 DIAGNOSIS — O320XX0 Maternal care for unstable lie, not applicable or unspecified: Secondary | ICD-10-CM | POA: Diagnosis present

## 2014-08-31 DIAGNOSIS — O26899 Other specified pregnancy related conditions, unspecified trimester: Secondary | ICD-10-CM | POA: Diagnosis present

## 2014-08-31 DIAGNOSIS — Z6791 Unspecified blood type, Rh negative: Secondary | ICD-10-CM

## 2014-08-31 LAB — CBC
HEMATOCRIT: 32 % — AB (ref 36.0–46.0)
Hemoglobin: 11.1 g/dL — ABNORMAL LOW (ref 12.0–15.0)
MCH: 32.3 pg (ref 26.0–34.0)
MCHC: 34.7 g/dL (ref 30.0–36.0)
MCV: 93 fL (ref 78.0–100.0)
PLATELETS: 229 10*3/uL (ref 150–400)
RBC: 3.44 MIL/uL — ABNORMAL LOW (ref 3.87–5.11)
RDW: 13.4 % (ref 11.5–15.5)
WBC: 8.4 10*3/uL (ref 4.0–10.5)

## 2014-08-31 MED ORDER — LIDOCAINE HCL (PF) 1 % IJ SOLN
30.0000 mL | INTRAMUSCULAR | Status: DC | PRN
  Filled 2014-08-31: qty 30

## 2014-08-31 MED ORDER — OXYCODONE-ACETAMINOPHEN 5-325 MG PO TABS: 1.0000 | ORAL_TABLET | ORAL | Status: DC | PRN

## 2014-08-31 MED ORDER — OXYCODONE-ACETAMINOPHEN 5-325 MG PO TABS
1.0000 | ORAL_TABLET | ORAL | Status: DC | PRN
  Administered 2014-09-01: 1 via ORAL
  Filled 2014-08-31: qty 1

## 2014-08-31 MED ORDER — FENTANYL 2.5 MCG/ML BUPIVACAINE 1/10 % EPIDURAL INFUSION (WH - ANES)
INTRAMUSCULAR | Status: AC
  Filled 2014-08-31: qty 125

## 2014-08-31 MED ORDER — EPHEDRINE 5 MG/ML INJ
10.0000 mg | INTRAVENOUS | Status: DC | PRN
  Filled 2014-08-31: qty 2

## 2014-08-31 MED ORDER — LIDOCAINE HCL (PF) 1 % IJ SOLN
INTRAMUSCULAR | Status: DC | PRN
  Administered 2014-08-31: 4 mL
  Administered 2014-08-31: 6 mL

## 2014-08-31 MED ORDER — IBUPROFEN 600 MG PO TABS
600.0000 mg | ORAL_TABLET | Freq: Four times a day (QID) | ORAL | Status: DC
  Administered 2014-09-01 (×2): 600 mg via ORAL
  Filled 2014-08-31 (×4): qty 1

## 2014-08-31 MED ORDER — CITRIC ACID-SODIUM CITRATE 334-500 MG/5ML PO SOLN: 30.0000 mL | ORAL | Status: DC | PRN

## 2014-08-31 MED ORDER — ZOLPIDEM TARTRATE 5 MG PO TABS: 5.0000 mg | ORAL_TABLET | Freq: Every evening | ORAL | Status: DC | PRN

## 2014-08-31 MED ORDER — ACETAMINOPHEN 325 MG PO TABS: 650.0000 mg | ORAL_TABLET | ORAL | Status: DC | PRN

## 2014-08-31 MED ORDER — DIPHENHYDRAMINE HCL 50 MG/ML IJ SOLN: 12.5000 mg | INTRAMUSCULAR | Status: DC | PRN

## 2014-08-31 MED ORDER — SIMETHICONE 80 MG PO CHEW: 80.0000 mg | CHEWABLE_TABLET | ORAL | Status: DC | PRN

## 2014-08-31 MED ORDER — DIPHENHYDRAMINE HCL 25 MG PO CAPS: 25.0000 mg | ORAL_CAPSULE | Freq: Four times a day (QID) | ORAL | Status: DC | PRN

## 2014-08-31 MED ORDER — FENTANYL 2.5 MCG/ML BUPIVACAINE 1/10 % EPIDURAL INFUSION (WH - ANES)
14.0000 mL/h | INTRAMUSCULAR | Status: DC | PRN
  Administered 2014-08-31: 14 mL/h via EPIDURAL

## 2014-08-31 MED ORDER — WITCH HAZEL-GLYCERIN EX PADS: 1.0000 "application " | MEDICATED_PAD | CUTANEOUS | Status: DC | PRN

## 2014-08-31 MED ORDER — FENTANYL 2.5 MCG/ML BUPIVACAINE 1/10 % EPIDURAL INFUSION (WH - ANES): 14.0000 mL/h | INTRAMUSCULAR | Status: DC | PRN

## 2014-08-31 MED ORDER — TERBUTALINE SULFATE 1 MG/ML IJ SOLN
0.2500 mg | Freq: Once | INTRAMUSCULAR | Status: DC | PRN
  Filled 2014-08-31: qty 1

## 2014-08-31 MED ORDER — LACTATED RINGERS IV SOLN: 500.0000 mL | INTRAVENOUS | Status: DC | PRN

## 2014-08-31 MED ORDER — DIBUCAINE 1 % RE OINT: 1.0000 "application " | TOPICAL_OINTMENT | RECTAL | Status: DC | PRN

## 2014-08-31 MED ORDER — PHENYLEPHRINE 40 MCG/ML (10ML) SYRINGE FOR IV PUSH (FOR BLOOD PRESSURE SUPPORT)
80.0000 ug | PREFILLED_SYRINGE | INTRAVENOUS | Status: DC | PRN
  Filled 2014-08-31: qty 2

## 2014-08-31 MED ORDER — BENZOCAINE-MENTHOL 20-0.5 % EX AERO: 1.0000 "application " | INHALATION_SPRAY | CUTANEOUS | Status: DC | PRN

## 2014-08-31 MED ORDER — BUTORPHANOL TARTRATE 1 MG/ML IJ SOLN: 1.0000 mg | INTRAMUSCULAR | Status: DC

## 2014-08-31 MED ORDER — LACTATED RINGERS IV SOLN
INTRAVENOUS | Status: DC
Start: 2014-08-31 — End: 2014-08-31
  Administered 2014-08-31: 14:00:00 via INTRAVENOUS

## 2014-08-31 MED ORDER — OXYCODONE-ACETAMINOPHEN 5-325 MG PO TABS: 2.0000 | ORAL_TABLET | ORAL | Status: DC | PRN

## 2014-08-31 MED ORDER — LANOLIN HYDROUS EX OINT: TOPICAL_OINTMENT | CUTANEOUS | Status: DC | PRN

## 2014-08-31 MED ORDER — SENNOSIDES-DOCUSATE SODIUM 8.6-50 MG PO TABS
2.0000 | ORAL_TABLET | ORAL | Status: DC
  Administered 2014-09-01: 2 via ORAL
  Filled 2014-08-31: qty 2

## 2014-08-31 MED ORDER — ONDANSETRON HCL 4 MG PO TABS: 4.0000 mg | ORAL_TABLET | ORAL | Status: DC | PRN

## 2014-08-31 MED ORDER — PRENATAL MULTIVITAMIN CH
1.0000 | ORAL_TABLET | Freq: Every day | ORAL | Status: DC
  Administered 2014-09-01: 1 via ORAL
  Filled 2014-08-31: qty 1

## 2014-08-31 MED ORDER — OXYTOCIN BOLUS FROM INFUSION
500.0000 mL | INTRAVENOUS | Status: DC
  Administered 2014-08-31: 500 mL via INTRAVENOUS

## 2014-08-31 MED ORDER — PHENYLEPHRINE 40 MCG/ML (10ML) SYRINGE FOR IV PUSH (FOR BLOOD PRESSURE SUPPORT)
PREFILLED_SYRINGE | INTRAVENOUS | Status: AC
  Filled 2014-08-31: qty 20

## 2014-08-31 MED ORDER — ONDANSETRON HCL 4 MG/2ML IJ SOLN
4.0000 mg | Freq: Four times a day (QID) | INTRAMUSCULAR | Status: DC | PRN
  Administered 2014-08-31: 4 mg via INTRAVENOUS
  Filled 2014-08-31: qty 2

## 2014-08-31 MED ORDER — OXYTOCIN 40 UNITS IN LACTATED RINGERS INFUSION - SIMPLE MED: 62.5000 mL/h | INTRAVENOUS | Status: DC

## 2014-08-31 MED ORDER — LACTATED RINGERS IV SOLN
500.0000 mL | Freq: Once | INTRAVENOUS | Status: AC
  Administered 2014-08-31: 500 mL via INTRAVENOUS

## 2014-08-31 MED ORDER — OXYTOCIN 40 UNITS IN LACTATED RINGERS INFUSION - SIMPLE MED
1.0000 m[IU]/min | INTRAVENOUS | Status: DC
  Administered 2014-08-31: 1 m[IU]/min via INTRAVENOUS
  Filled 2014-08-31: qty 1000

## 2014-08-31 MED ORDER — FLEET ENEMA 7-19 GM/118ML RE ENEM: 1.0000 | ENEMA | RECTAL | Status: DC | PRN

## 2014-08-31 MED ORDER — TETANUS-DIPHTH-ACELL PERTUSSIS 5-2.5-18.5 LF-MCG/0.5 IM SUSP: 0.5000 mL | Freq: Once | INTRAMUSCULAR | Status: DC

## 2014-08-31 MED ORDER — ONDANSETRON HCL 4 MG/2ML IJ SOLN: 4.0000 mg | INTRAMUSCULAR | Status: DC | PRN

## 2014-08-31 NOTE — Anesthesia Procedure Notes (Signed)

## 2014-08-31 NOTE — H&P (Addendum)
Melissa BeechamLauren N Stark is a 29 y.o. female G2P1001, at 39.6 wks presenting for IOL due to favorable cervix, unstable lie (vtx now) and travel concerns in bad weather. Denies pain/ bleeding/ leaking at admission and noted good FMs.  [facility] at [facility]. H/o partial thyroidectomy but normal TSH without medication. Stable tachycardia. Rh neg, s/p Rhogam.  Marginal cord insertion with good interval growth at 28 wks. Nausea/ vomiting entire pregnancy with Diclegis and Zofran use.  Breech presentation with spontaneous version at 37 wks (the day of version), prior IOL with SVD.   History OB History    Gravida Para Term Preterm AB TAB SAB Ectopic Multiple Living   2 1 1       1      Past Medical History  Diagnosis Date  . Dysrhythmia     hx of tachycardia  . 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 PP care, s/p SVD 12/24 08/04/2011  . GERD (gastroesophageal reflux disease)    Past Surgical History  Procedure Laterality Date  . Thyroidectomy, partial     Family History: family history includes COPD in her paternal grandfather; Cancer in her mother; Diabetes in her maternal grandfather and paternal grandfather; Heart disease in her maternal grandfather and paternal grandfather; Hypertension in her paternal grandfather and paternal grandmother; Kidney disease in her maternal grandfather; Peripheral vascular disease in her paternal grandmother. Social History:  reports that she has never smoked. She has never used smokeless tobacco. She reports that she does not drink alcohol or use illicit drugs.   Prenatal Transfer Tool  Maternal Diabetes: No Genetic Screening: Normal QUAD screen  Maternal Ultrasounds/Referrals: Normal, marginal cord with good interval growth at 28 wks, bilateral choroid plexus cysts, no other abnormality Fetal Ultrasounds or other Referrals:  None Maternal Substance Abuse:  No Significant Maternal Medications:  Diclegis and Zofran through pregncy for  nausea/vomit Significant Maternal Lab Results:  Lab values include: Group B Strep negative, Rh negative  Other Comments:  Partial thyroidectomy but euthyroid, no med.  ROS  Neg   Dilation: 3 Effacement (%): 50 Station: Ballotable Exam by:: Dr Kendra Grissett Blood pressure 114/82, pulse 98, temperature 98.2 F (36.8 C), temperature source Oral, resp. rate 20, height 5\' 3"  (1.6 m), weight 134 lb (60.782 kg), last menstrual period 11/29/2013, SpO2 98 %, unknown if currently breastfeeding. Exam Physical Exam  Physical exam:  A&O x 3, no acute distress. Pleasant HEENT neg, no thyromegaly Lungs CTA bilat CV RRR, A1S2 normal Abdo soft, non tender, non acute Extr no edema/ tenderness Pelvic above, controlled AROM, clear fluid, head well applied  FHT  145/ mod variab/ accels noted, no decels- category I Toco irreg with pitocin at 4 mu rate  Prenatal labs: ABO, Rh: O/Negative/-- (06/16 0000) Antibody: Negative (06/16 0000) Rubella:  Immune RPR: Nonreactive (06/16 0000)  HBsAg: Negative (06/16 0000)  HIV: Non-reactive (06/16 0000)  GBS: Negative (12/23 0000)  Glucola normal TSH normal QUAD screen normal  Assessment/Plan: 29 yo G2P1001 at 39.6 wks for IOL due to unstable lie, now VTX. Low dose pitocin. GBS neg. Epidural in active labor. EFW 7.1/2 lbs, anticipate SVD.    Esly Selvage R 08/31/2014, 2:47 PM

## 2014-08-31 NOTE — Progress Notes (Signed)
Melissa BeechamLauren N Stark is a 29 y.o. G2P1001 at 3326w6d, IOL due to favorable cx and pt request due to imminent poor weather.  S/p epidural. Feels better after vomiting.   Objective: BP 114/82 mmHg  Pulse 98  Temp(Src) 98.2 F (36.8 C) (Oral)  Resp 20  Ht 5\' 3"  (1.6 m)  Wt 134 lb (60.782 kg)  BMI 23.74 kg/m2  SpO2 98%  LMP 11/29/2013    FHT: category I, 120s bpm, variability: moderate,  accelerations:  Present,  decelerations:  Absent UC:   regular, every 2-3 minutes SVE:   Dilation: 5.5 Effacement (%): 90 Station: -2 Exam by:: SH RNC   Assessment / Plan: Induction of labor due to term with favorable cervix,  progressing well on pitocin  Labor: Progressing normally Preeclampsia:  none Fetal Wellbeing:  Category I Pain Control:  Epidural I/D:  n/a Anticipated MOD:  NSVD  Gwyn Hieronymus R 08/31/2014, 5:16 PM

## 2014-08-31 NOTE — Anesthesia Preprocedure Evaluation (Signed)
Anesthesia Evaluation  Patient identified by MRN, date of birth, ID band Patient awake    Reviewed: Allergy & Precautions, H&P , Patient's Chart, lab work & pertinent test results  Airway Mallampati: II  TM Distance: >3 FB Neck ROM: full    Dental  (+) Teeth Intact   Pulmonary  breath sounds clear to auscultation        Cardiovascular Rhythm:regular Rate:Normal     Neuro/Psych    GI/Hepatic   Endo/Other    Renal/GU      Musculoskeletal   Abdominal   Peds  Hematology   Anesthesia Other Findings Dysrhythmia......Marland Kitchen.hx of tachycardia; No Meds or Rx developing IUGR - for IOL     Reproductive/Obstetrics (+) Pregnancy                             Anesthesia Physical Anesthesia Plan  ASA: II  Anesthesia Plan: Epidural   Post-op Pain Management:    Induction:   Airway Management Planned:   Additional Equipment:   Intra-op Plan:   Post-operative Plan:   Informed Consent: I have reviewed the patients History and Physical, chart, labs and discussed the procedure including the risks, benefits and alternatives for the proposed anesthesia with the patient or authorized representative who has indicated his/her understanding and acceptance.   Dental Advisory Given  Plan Discussed with:   Anesthesia Plan Comments: (Labs checked- platelets confirmed with RN in room. Fetal heart tracing, per RN, reported to be stable enough for sitting procedure. Discussed epidural, and patient consents to the procedure:  included risk of possible headache,backache, failed block, allergic reaction, and nerve injury. This patient was asked if she had any questions or concerns before the procedure started.)        Anesthesia Quick Evaluation

## 2014-08-31 NOTE — Progress Notes (Signed)
Patient ID: Melissa BeechamLauren N Kobayashi, female   DOB: 09/29/1985, 29 y.o.   MRN: [id number]   Subjective: Melissa Stark is a 29 y.o. G2P1001 at 7052w6d by ultrasound admitted for induction of labor due to favorable cervix and environmental constraints.  Feeling fine, sitting up in bed on cell phone.  Complaints of mild menstrual-like cramps.  Objective: Filed Vitals:   08/31/14 0810 08/31/14 0935  BP: 108/71 116/72  Pulse: 102 99  Temp: 98.2 F (36.8 C)   TempSrc: Oral   Resp: 20   SpO2: 99%         FHT:  FHR: 140 bpm, variability: moderate,  accelerations:  Present,  decelerations:  Absent UC:   irregular, every 3-5 minutes SVE:   Dilation: 3 Effacement (%): 70 Station: Ballotable Exam by:: [employer]xon Mobil CorporationSandra Holleman RNC  Labs:   Recent Labs  08/31/14 0755  WBC 8.4  HGB 11.1*  HCT 32.0*  PLT 229    Assessment / Plan: Induction of labor due to favorable cervix,  progressing well on pitocin  Labor: Progressing normally Preeclampsia:  N/A Fetal Wellbeing:  Category I Pain Control:  Labor support without medications I/D:  n/a Anticipated MOD:  NSVD  Kenard GowerAWSON, Clarita Mcelvain, M, MSN, CNM 08/31/2014, 11:10 AM

## 2014-09-01 ENCOUNTER — Encounter (HOSPITAL_COMMUNITY): Payer: Self-pay

## 2014-09-01 DIAGNOSIS — D62 Acute posthemorrhagic anemia: Secondary | ICD-10-CM | POA: Diagnosis not present

## 2014-09-01 DIAGNOSIS — O26899 Other specified pregnancy related conditions, unspecified trimester: Secondary | ICD-10-CM | POA: Diagnosis present

## 2014-09-01 DIAGNOSIS — Z6791 Unspecified blood type, Rh negative: Secondary | ICD-10-CM | POA: Diagnosis present

## 2014-09-01 LAB — CBC
HEMATOCRIT: 28.7 % — AB (ref 36.0–46.0)
Hemoglobin: 9.7 g/dL — ABNORMAL LOW (ref 12.0–15.0)
MCH: 31.9 pg (ref 26.0–34.0)
MCHC: 33.8 g/dL (ref 30.0–36.0)
MCV: 94.4 fL (ref 78.0–100.0)
Platelets: 204 10*3/uL (ref 150–400)
RBC: 3.04 MIL/uL — AB (ref 3.87–5.11)
RDW: 13.5 % (ref 11.5–15.5)
WBC: 10.5 10*3/uL (ref 4.0–10.5)

## 2014-09-01 LAB — RPR: RPR: NONREACTIVE

## 2014-09-01 MED ORDER — POLYSACCHARIDE IRON COMPLEX 150 MG PO CAPS
150.0000 mg | ORAL_CAPSULE | Freq: Two times a day (BID) | ORAL | Status: DC
  Administered 2014-09-01: 150 mg via ORAL
  Filled 2014-09-01: qty 1

## 2014-09-01 MED ORDER — RHO D IMMUNE GLOBULIN 1500 UNIT/2ML IJ SOSY
300.0000 ug | PREFILLED_SYRINGE | Freq: Once | INTRAMUSCULAR | Status: AC
  Administered 2014-09-01: 300 ug via INTRAVENOUS
  Filled 2014-09-01: qty 2

## 2014-09-01 MED ORDER — IBUPROFEN 600 MG PO TABS: 600.0000 mg | ORAL_TABLET | Freq: Four times a day (QID) | ORAL | Status: DC

## 2014-09-01 MED ORDER — MAGNESIUM OXIDE 400 (241.3 MG) MG PO TABS: 200.0000 mg | ORAL_TABLET | Freq: Every day | ORAL | Status: DC

## 2014-09-01 MED ORDER — MAGNESIUM OXIDE 400 (241.3 MG) MG PO TABS
200.0000 mg | ORAL_TABLET | Freq: Every day | ORAL | Status: DC
  Filled 2014-09-01 (×2): qty 0.5

## 2014-09-01 MED ORDER — POLYSACCHARIDE IRON COMPLEX 150 MG PO CAPS: 150.0000 mg | ORAL_CAPSULE | Freq: Two times a day (BID) | ORAL | Status: DC

## 2014-09-01 NOTE — Anesthesia Postprocedure Evaluation (Signed)
Anesthesia Post Note  Patient: Heloise BeechamLauren N Dunaj  Procedure(s) Performed: * No procedures listed *  Anesthesia type: Epidural  Patient location: Mother/Baby  Post pain: Pain level controlled  Post assessment: Post-op Vital signs reviewed  Last Vitals:  Filed Vitals:   09/01/14 0140  BP: 122/78  Pulse: 82  Temp: 36.9 C  Resp: 18    Post vital signs: Reviewed  Level of consciousness:alert  Complications: No apparent anesthesia complications

## 2014-09-01 NOTE — Progress Notes (Signed)
[name] was referred for history of depression/anxiety.  Referral is screened out by Clinical Social Worker because none of the following criteria appear to apply: -History of anxiety/depression during this pregnancy, or of post-partum depression. - Diagnosis of anxiety and/or depression within last 3 years - History of depression due to pregnancy loss/loss of child or -[name]'s symptoms are currently being treated with medication and/or therapy.  CSW completed chart review. In one prenatal record, she reported feeling anxious during one visit due to appropriate concern about the baby; however, it was not documented as an active/ongoing problem.  Additionally, [name] does not have anxiety listed as an active problem during this pregnancy or during her pregnancy in 2012.  Please contact the Clinical Social Worker if needs arise or upon [name] request.   Melissa BooksSarah Annie Stark MSW, LCSW Clinical Social Worker [phone]

## 2014-09-01 NOTE — Lactation Note (Signed)
This note was copied from the chart of Melissa Stark Casique. Lactation Consultation Note  Patient Name: Melissa Stark Feider ZOXWR'UToday's Date: 09/01/2014 Reason for consult: Initial assessment Baby 18 hours of life. Mom reports that baby is sleepy and not wanting to latch well. Assisted mom to latch baby in football position. Mom reports that her wrists are hurting, so demonstrated to mom how to support her hand with a blanket roll once baby latched. Attempted to latch baby several times, baby licking breasts and disorganized. LC let baby suck on LC's gloved finger, and after a few minutes the baby began to suck rhythmically. Mom able to hand express colostrum and enc baby to open mouth widely, baby latched deeply with lips flanged and suckled rhythmically with a few swallows noted.   Enc mom to offer breasts with cues and at least 8-12 times/24 hours. Discussed waking techniques and enc STS. [facility], aware of OP/BFS[facility], community resources, and [facility] phone line assistance after D/C.   Mom states that she has two personal DEBPs at home. Reviewed nursing/pumping and returning to work.   Maternal Data Has patient been taught Hand Expression?: Yes Does the patient have breastfeeding experience prior to this delivery?: Yes  Feeding Feeding Type: Breast Fed Length of feed:  (LC assessed first 20 minutes of BF.)  LATCH Score/Interventions Latch: Repeated attempts needed to sustain latch, nipple held in mouth throughout feeding, stimulation needed to elicit sucking reflex. (Baby sleepy, demonstrated how to stimulate baby to keep nursing.) Intervention(s): Adjust position;Assist with latch;Breast compression  Audible Swallowing: A few with stimulation Intervention(s): Hand expression  Type of Nipple: Flat (Short shaft, but everts with stimulation of baby nursing.)  Comfort (Breast/Nipple): Soft / non-tender     Hold (Positioning): Assistance needed to correctly position infant at breast  and maintain latch. Intervention(s): Breastfeeding basics reviewed;Support Pillows;Position options  LATCH Score: 6  Lactation Tools Discussed/Used     Consult Status Consult Status: Follow-up Date: 09/02/14 Follow-up type: In-patient    Geralynn OchsWILLIARD, Laportia Carley 09/01/2014, 12:45 PM

## 2014-09-01 NOTE — Discharge Summary (Signed)
Obstetric Discharge Summary Reason for Admission: induction of labor Prenatal Course: complicated by hx partial thyroidectomy-stable TSH, marginal CI, HEG, Rh negative-Rhogam given, and stable tachycardia. Intrapartum Procedures: spontaneous vaginal delivery Postpartum Procedures: Rho(D) Ig Complications-Operative and Postpartum: ABL anemia HEMOGLOBIN  Date Value Ref Range Status  09/01/2014 9.7* 12.0 - 15.0 g/dL Final   HCT  Date Value Ref Range Status  09/01/2014 28.7* 36.0 - 46.0 % Final    Physical Exam:  General: alert and cooperative Lochia: appropriate Uterine Fundus: firm DVT Evaluation: No evidence of DVT seen on physical exam. Negative Homan's sign. No cords or calf tenderness. No significant calf/ankle edema.  Discharge Diagnoses: Term Pregnancy-delivered , ABL anemia Discharge Information: Date: 09/01/2014 Activity: pelvic rest Diet: routine Medications: PNV, Ibuprofen, Iron and Mag oxide Condition: stable Instructions: refer to practice specific booklet Discharge to: home Follow-up Information    [facility]. Schedule an appointment as soon as possible for a visit in 6 weeks.   Specialty:  Obstetrics and Gynecology   Contact information:   [address] Severe[address] [place] [id number] [phone]       Newborn Data: Live born female on 08/31/14 Birth Weight: 7 lb 7.6 oz (3390 g) APGAR: 8, 9  Home with mother.  Melissa Stark, N 09/01/2014, 6:40 PM

## 2014-09-01 NOTE — Progress Notes (Signed)
PPD #1- SVD  Subjective:   Reports feeling well, desires early discharge  Tolerating po/ No nausea or vomiting Bleeding is light Pain controlled with Motrin and Percocet Up ad lib / ambulatory / voiding without problems Newborn: breastfeeding     Objective:   VS:  VS:  Filed Vitals:   08/31/14 2000 08/31/14 2035 08/31/14 2145 09/01/14 0140  BP: 114/76 127/81 116/67 122/78  Pulse: 109 115 114 82  Temp:  98.6 F (37 C) 98.6 F (37 C) 98.4 F (36.9 C)  TempSrc:  Oral Oral Oral  Resp:  18 18 18   Height:      Weight:      SpO2:  97% 97% 98%    LABS:  Recent Labs  08/31/14 0755 09/01/14 0550  WBC 8.4 10.5  HGB 11.1* 9.7*  PLT 229 204   Blood type: O/Negative/-- (06/16 0000) Rubella:     I&O: Intake/Output    None     Physical Exam: Alert and oriented x3 Abdomen: soft, non-tender, non-distended  Fundus: firm, non-tender, U-2 Perineum: intact, no edema or erythema Lochia: small Extremities: No edema, no calf pain or tenderness    Assessment:  PPD #1 G2P1001/ S/P:induced vaginal Rh negative-newborn Rh pos ABL anemia  Doing well    Plan: Start Niferex Rhogam before discharge Continue routine post partum orders Possible d/c tonight pending newborn discharge   Donette LarryBHAMBRI, Malaki Koury, N MSN, CNM 09/01/2014, 9:31 AM

## 2014-09-02 LAB — RH IG WORKUP (INCLUDES ABO/RH)
ABO/RH(D): O NEG
ANTIBODY SCREEN: POSITIVE
DAT, IGG: NEGATIVE
FETAL SCREEN: NEGATIVE
Gestational Age(Wks): 40
Unit division: 0

## 2014-09-04 ENCOUNTER — Inpatient Hospital Stay (HOSPITAL_COMMUNITY): Admission: RE | Admit: 2014-09-04 | Payer: 59 | Source: Ambulatory Visit

## 2015-10-02 ENCOUNTER — Ambulatory Visit: Payer: Self-pay | Admitting: Family Medicine

## 2015-10-02 ENCOUNTER — Encounter: Payer: Self-pay | Admitting: Family Medicine

## 2015-10-02 ENCOUNTER — Ambulatory Visit (INDEPENDENT_AMBULATORY_CARE_PROVIDER_SITE_OTHER): Payer: 59 | Admitting: Family Medicine

## 2015-10-02 VITALS — BP 124/70 | HR 70 | Ht 69.0 in | Wt 183.0 lb

## 2015-10-02 DIAGNOSIS — M79643 Pain in unspecified hand: Secondary | ICD-10-CM | POA: Diagnosis not present

## 2015-10-02 DIAGNOSIS — M25532 Pain in left wrist: Secondary | ICD-10-CM | POA: Diagnosis not present

## 2015-10-02 DIAGNOSIS — M25531 Pain in right wrist: Secondary | ICD-10-CM

## 2015-10-02 NOTE — Progress Notes (Signed)
Name: Melissa Stark   MRN: [id number]    DOB: 10/12/1985   Date:10/02/2015       Progress Note  Subjective  Chief Complaint  Chief Complaint  Patient presents with  . Establish Care  . Wrist Pain    works on a computer- wrist, fingers, and joints hurt- strong hx of arthritis in family    Wrist Pain  The pain is present in the left wrist, right wrist, right fingers, right hand, left fingers and left hand. This is a recurrent problem. The current episode started more than 1 year ago. There has been no history of extremity trauma. The problem occurs daily. The problem has been waxing and waning. The quality of the pain is described as aching. The pain is at a severity of 4/10. The pain is moderate. Associated symptoms include joint locking, joint swelling, a limited range of motion, numbness, stiffness and tingling. Pertinent negatives include no fever, inability to bear weight or itching. The symptoms are aggravated by cold. She has tried NSAIDS for the symptoms. The treatment provided mild relief.    No problem-specific assessment & plan notes found for this encounter.   Past Medical History  Diagnosis Date  . Dysrhythmia     hx of tachycardia  . 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 PP care, s/p SVD 12/24 08/04/2011  . GERD (gastroesophageal reflux disease)     Past Surgical History  Procedure Laterality Date  . Thyroidectomy, partial      Family History  Problem Relation Age of Onset  . Cancer Mother     melanoma  . Kidney disease Maternal Grandfather     chronic renal failure  . Diabetes Maternal Grandfather   . Heart disease Maternal Grandfather   . Hypertension Paternal Grandmother   . Peripheral vascular disease Paternal Grandmother   . COPD Paternal Grandfather   . Diabetes Paternal Grandfather   . Hypertension Paternal Grandfather   . Heart disease Paternal Grandfather     Social History   Social History  . Marital Status: Married    Spouse  Name: N/A  . Number of Children: N/A  . Years of Education: N/A   Occupational History  . Not on file.   Social History Main Topics  . Smoking status: Never Smoker   . Smokeless tobacco: Never Used  . Alcohol Use: No  . Drug Use: No  . Sexual Activity: Yes   Other Topics Concern  . Not on file   Social History Narrative    No Known Allergies   Review of Systems  Constitutional: Negative for fever, chills, weight loss and malaise/fatigue.  HENT: Negative for ear discharge, ear pain and sore throat.   Eyes: Negative for blurred vision.  Respiratory: Positive for shortness of breath. Negative for cough, sputum production and wheezing.   Cardiovascular: Positive for palpitations. Negative for chest pain and leg swelling.  Gastrointestinal: Negative for heartburn, nausea, abdominal pain, diarrhea, constipation, blood in stool and melena.  Genitourinary: Negative for dysuria, urgency, frequency and hematuria.  Musculoskeletal: Positive for stiffness. Negative for myalgias, back pain, joint pain and neck pain.  Skin: Negative for itching and rash.  Neurological: Positive for tingling and numbness. Negative for dizziness, sensory change, focal weakness and headaches.  Endo/Heme/Allergies: Negative for environmental allergies and polydipsia. Does not bruise/bleed easily.  Psychiatric/Behavioral: Negative for depression and suicidal ideas. The patient is not nervous/anxious and does not have insomnia.      Objective  Filed Vitals:  10/02/15 1529  BP: 124/70  Pulse: 70  Height: '5\' 9"'$  (1.753 m)  Weight: 183 lb (83.008 kg)    Physical Exam  Constitutional: She is well-developed, well-nourished, and in no distress. No distress.  HENT:  Head: Normocephalic and atraumatic.  Right Ear: External ear normal.  Left Ear: External ear normal.  Nose: Nose normal.  Mouth/Throat: Oropharynx is clear and moist.  Eyes: Conjunctivae and EOM are normal. Pupils are equal, round, and  reactive to light. Right eye exhibits no discharge. Left eye exhibits no discharge.  Neck: Normal range of motion. Neck supple. No JVD present. No thyromegaly present.  Cardiovascular: Normal rate, regular rhythm, normal heart sounds and intact distal pulses.  Exam reveals no gallop and no friction rub.   No murmur heard. Pulmonary/Chest: Effort normal and breath sounds normal.  Abdominal: Soft. Bowel sounds are normal. She exhibits no mass. There is no tenderness. There is no guarding.  Musculoskeletal: She exhibits no edema.       Right wrist: She exhibits no tenderness.       Left wrist: She exhibits no tenderness.       Right hand: She exhibits tenderness.       Left hand: She exhibits tenderness.  Negative Tinel/Phelen  Lymphadenopathy:    She has no cervical adenopathy.  Neurological: She is alert. She has normal motor skills, normal sensation and normal strength.  Skin: Skin is warm and dry. She is not diaphoretic.  Psychiatric: Mood and affect normal.  Nursing note and vitals reviewed.     Assessment & Plan  Problem List Items Addressed This Visit    None    Visit Diagnoses    Pain in both wrists    -  Primary    Relevant Orders    Sed Rate (ESR)    Rheumatoid Factor    Pain of hand, unspecified laterality        Relevant Orders    Sed Rate (ESR)    Rheumatoid Factor         Dr. Otilio Miu [facility]  10/02/2015

## 2015-10-03 LAB — RHEUMATOID FACTOR: Rhuematoid fact SerPl-aCnc: <10 IU/mL (ref 0.0–13.9)

## 2015-10-03 LAB — SEDIMENTATION RATE: Sed Rate: 4 mm/hr (ref 0–32)

## 2016-04-28 ENCOUNTER — Ambulatory Visit (INDEPENDENT_AMBULATORY_CARE_PROVIDER_SITE_OTHER): Payer: 59 | Admitting: Family Medicine

## 2016-04-28 ENCOUNTER — Encounter: Payer: Self-pay | Admitting: Family Medicine

## 2016-04-28 VITALS — BP 122/80 | HR 80 | Ht 69.0 in | Wt 164.0 lb

## 2016-04-28 DIAGNOSIS — E041 Nontoxic single thyroid nodule: Secondary | ICD-10-CM | POA: Diagnosis not present

## 2016-04-28 DIAGNOSIS — Z0000 Encounter for general adult medical examination without abnormal findings: Secondary | ICD-10-CM | POA: Diagnosis not present

## 2016-04-28 NOTE — Progress Notes (Signed)
Name: Melissa BeechamLauren N Stark   MRN: [id number]    DOB: 11/04/1985   Date:04/28/2016       Progress Note  Subjective  Chief Complaint  Chief Complaint  Patient presents with  . Annual Exam    for work- no pap    Patient present for annual physical exam and employment exam. History of subtotal thyroidectomy for follicular adenoma.    No problem-specific Assessment & Plan notes found for this encounter.   Past Medical History:  Diagnosis Date  . 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 Dysrhythmia    hx of tachycardia  . GERD (gastroesophageal reflux disease)   . PP care, s/p SVD 12/24 08/04/2011    Past Surgical History:  Procedure Laterality Date  . THYROIDECTOMY, PARTIAL      Family History  Problem Relation Age of Onset  . Cancer Mother     melanoma  . Kidney disease Maternal Grandfather     chronic renal failure  . Diabetes Maternal Grandfather   . Heart disease Maternal Grandfather   . Hypertension Paternal Grandmother   . Peripheral vascular disease Paternal Grandmother   . COPD Paternal Grandfather   . Diabetes Paternal Grandfather   . Hypertension Paternal Grandfather   . Heart disease Paternal Grandfather     Social History   Social History  . Marital status: Married    Spouse name: N/A  . Number of children: N/A  . Years of education: N/A   Occupational History  . Not on file.   Social History Main Topics  . Smoking status: Never Smoker  . Smokeless tobacco: Never Used  . Alcohol use No  . Drug use: No  . Sexual activity: Yes   Other Topics Concern  . Not on file   Social History Narrative  . No narrative on file    No Known Allergies   Review of Systems  Constitutional: Negative for chills, fever, malaise/fatigue and weight loss.  HENT: Negative for ear discharge, ear pain and sore throat.   Eyes: Negative for blurred vision.  Respiratory: Negative for cough, sputum production, shortness of breath and wheezing.    Cardiovascular: Negative for chest pain, palpitations and leg swelling.  Gastrointestinal: Negative for abdominal pain, blood in stool, constipation, diarrhea, heartburn, melena and nausea.  Genitourinary: Negative for dysuria, frequency, hematuria and urgency.  Musculoskeletal: Negative for back pain, joint pain, myalgias and neck pain.  Skin: Negative for rash.  Neurological: Negative for dizziness, tingling, sensory change, focal weakness and headaches.  Endo/Heme/Allergies: Negative for environmental allergies and polydipsia. Does not bruise/bleed easily.  Psychiatric/Behavioral: Negative for depression and suicidal ideas. The patient is not nervous/anxious and does not have insomnia.      Objective  Vitals:   04/28/16 0847  BP: 122/80  Pulse: 80  Weight: 164 lb (74.4 kg)  Height: 5\' 9"  (1.753 m)    Physical Exam  Constitutional: She is oriented to person, place, and time and well-developed, well-nourished, and in no distress. No distress.  HENT:  Head: Normocephalic and atraumatic.  Right Ear: Tympanic membrane, external ear and ear canal normal.  Left Ear: Tympanic membrane, external ear and ear canal normal.  Nose: Nose normal.  Mouth/Throat: Oropharynx is clear and moist and mucous membranes are normal.  Eyes: Conjunctivae and EOM are normal. Pupils are equal, round, and reactive to light. Right eye exhibits no discharge. Left eye exhibits no discharge.  Neck: Trachea normal and normal range of motion. Neck supple. Normal carotid pulses,  no hepatojugular reflux and no JVD present. Carotid bruit is not present. No thyromegaly present.  Cardiovascular: Normal rate, regular rhythm, S1 normal, S2 normal, normal heart sounds, intact distal pulses and normal pulses.  PMI is not displaced.  Exam reveals no gallop, no S3, no S4 and no friction rub.   No murmur heard. Pulmonary/Chest: Effort normal and breath sounds normal.  Abdominal: Soft. Bowel sounds are normal. She exhibits no  mass. There is no hepatosplenomegaly, splenomegaly or hepatomegaly. There is no tenderness. There is no rebound, no guarding and no CVA tenderness.  Musculoskeletal: Normal range of motion. She exhibits no edema.  Lymphadenopathy:       Head (right side): No submental and no submandibular adenopathy present.       Head (left side): No submental and no submandibular adenopathy present.    She has no cervical adenopathy.    She has no axillary adenopathy.  Neurological: She is alert and oriented to person, place, and time. She has normal sensation, normal strength, normal reflexes and intact cranial nerves.  Skin: Skin is warm, dry and intact. She is not diaphoretic.  Psychiatric: Mood and affect normal.  Nursing note and vitals reviewed.     Assessment & Plan  Problem List Items Addressed This Visit    None    Visit Diagnoses    Annual physical exam    -  Primary   Relevant Orders   Lipid Profile   Glucose   Left thyroid nodule       right thypoidectomy for follicular adenoma   Relevant Orders   US THYROID   TSH        Dr. Hayden [facility]  04/28/16

## 2016-04-29 LAB — GLUCOSE, RANDOM: Glucose: 93 mg/dL (ref 65–99)

## 2016-04-29 LAB — LIPID PANEL
CHOL/HDL RATIO: 3.1 ratio (ref 0.0–4.4)
Cholesterol, Total: 192 mg/dL (ref 100–199)
HDL: 61 mg/dL (ref >39)
LDL Calculated: 117 mg/dL — ABNORMAL HIGH (ref 0–99)
TRIGLYCERIDES: 71 mg/dL (ref 0–149)
VLDL CHOLESTEROL CAL: 14 mg/dL (ref 5–40)

## 2016-04-29 LAB — TSH: TSH: 0.897 u[IU]/mL (ref 0.450–4.500)

## 2016-05-02 ENCOUNTER — Ambulatory Visit: Payer: 59

## 2016-05-14 ENCOUNTER — Ambulatory Visit
Admission: RE | Admit: 2016-05-14 | Discharge: 2016-05-14 | Disposition: A | Discharge status: Home / Self Care | Payer: 59 | Source: Ambulatory Visit | Attending: Family Medicine | Admitting: Family Medicine

## 2016-05-14 DIAGNOSIS — E041 Nontoxic single thyroid nodule: Secondary | ICD-10-CM

## 2016-05-15 ENCOUNTER — Other Ambulatory Visit: Payer: Self-pay

## 2016-05-15 DIAGNOSIS — E041 Nontoxic single thyroid nodule: Secondary | ICD-10-CM

## 2016-06-05 ENCOUNTER — Other Ambulatory Visit: Payer: Self-pay | Admitting: Otolaryngology

## 2016-06-05 DIAGNOSIS — E041 Nontoxic single thyroid nodule: Secondary | ICD-10-CM

## 2016-06-11 ENCOUNTER — Ambulatory Visit (INDEPENDENT_AMBULATORY_CARE_PROVIDER_SITE_OTHER): Payer: 59 | Admitting: Family Medicine

## 2016-06-11 ENCOUNTER — Encounter: Payer: Self-pay | Admitting: Family Medicine

## 2016-06-11 VITALS — BP 120/78 | HR 80 | Ht 69.0 in | Wt 162.0 lb

## 2016-06-11 DIAGNOSIS — M6281 Muscle weakness (generalized): Secondary | ICD-10-CM

## 2016-06-11 DIAGNOSIS — M791 Myalgia, unspecified site: Secondary | ICD-10-CM

## 2016-06-11 NOTE — Progress Notes (Signed)
Name: Melissa Stark   MRN: [id number]    DOB: 03/02/1986   Date:06/11/2016       Progress Note  Subjective  Chief Complaint  Chief Complaint  Patient presents with  . Extremity Weakness    x 3 weeks- "felt like the flu but no sickness"- no exercising/ workouts    Extremity Weakness   The pain is present in the left upper leg, left lower leg, right upper leg and right lower leg. This is a new problem. The current episode started 1 to 4 weeks ago. There has been no history of extremity trauma. The problem occurs intermittently. The problem has been waxing and waning. The quality of the pain is described as aching. The pain is at a severity of 3/10. The pain is mild. Associated symptoms include tingling. Pertinent negatives include no fever, inability to bear weight, itching, joint locking, joint swelling, limited range of motion, numbness or stiffness. The symptoms are aggravated by activity (walked 3 miles). She has tried NSAIDS and acetaminophen for the symptoms. The treatment provided mild relief.    No problem-specific Assessment & Plan notes found for this encounter.   Past Medical History:  Diagnosis Date  . 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 Dysrhythmia    hx of tachycardia  . GERD (gastroesophageal reflux disease)   . PP care, s/p SVD 12/24 08/04/2011    Past Surgical History:  Procedure Laterality Date  . THYROIDECTOMY, PARTIAL      Family History  Problem Relation Age of Onset  . Cancer Mother     melanoma  . Kidney disease Maternal Grandfather     chronic renal failure  . Diabetes Maternal Grandfather   . Heart disease Maternal Grandfather   . Hypertension Paternal Grandmother   . Peripheral vascular disease Paternal Grandmother   . COPD Paternal Grandfather   . Diabetes Paternal Grandfather   . Hypertension Paternal Grandfather   . Heart disease Paternal Grandfather     Social History   Social History  . Marital status: Married    Spouse  name: N/A  . Number of children: N/A  . Years of education: N/A   Occupational History  . Not on file.   Social History Main Topics  . Smoking status: Never Smoker  . Smokeless tobacco: Never Used  . Alcohol use No  . Drug use: No  . Sexual activity: Yes   Other Topics Concern  . Not on file   Social History Narrative  . No narrative on file    No Known Allergies   Review of Systems  Constitutional: Negative for chills, fever, malaise/fatigue and weight loss.  HENT: Negative for ear discharge, ear pain and sore throat.   Eyes: Negative for blurred vision.  Respiratory: Negative for cough, sputum production, shortness of breath and wheezing.   Cardiovascular: Negative for chest pain, palpitations and leg swelling.  Gastrointestinal: Negative for abdominal pain, blood in stool, constipation, diarrhea, heartburn, melena and nausea.  Genitourinary: Negative for dysuria, frequency, hematuria and urgency.  Musculoskeletal: Positive for extremity weakness. Negative for back pain, joint pain, myalgias, neck pain and stiffness.  Skin: Negative for itching and rash.  Neurological: Positive for tingling. Negative for dizziness, sensory change, focal weakness, numbness and headaches.  Endo/Heme/Allergies: Negative for environmental allergies and polydipsia. Does not bruise/bleed easily.  Psychiatric/Behavioral: Negative for depression and suicidal ideas. The patient is not nervous/anxious and does not have insomnia.      Objective  Vitals:  06/11/16 0810  BP: 120/78  Pulse: 80  Weight: 162 lb (73.5 kg)  Height: 5\' 9"  (1.753 m)    Physical Exam  Constitutional: She is well-developed, well-nourished, and in no distress. No distress.  HENT:  Head: Normocephalic and atraumatic.  Right Ear: External ear normal.  Left Ear: External ear normal.  Nose: Nose normal.  Mouth/Throat: Oropharynx is clear and moist.  Eyes: Conjunctivae and EOM are normal. Pupils are equal, round, and  reactive to light. Right eye exhibits no discharge. Left eye exhibits no discharge.  Neck: Normal range of motion. Neck supple. No JVD present. No thyromegaly present.  Cardiovascular: Normal rate, regular rhythm, normal heart sounds and intact distal pulses.  Exam reveals no gallop and no friction rub.   No murmur heard. Pulmonary/Chest: Effort normal and breath sounds normal. She has no wheezes. She has no rales.  Abdominal: Soft. Bowel sounds are normal. She exhibits no mass. There is no tenderness. There is no guarding.  Musculoskeletal: Normal range of motion. She exhibits no edema, tenderness or deformity.  Lymphadenopathy:    She has no cervical adenopathy.  Neurological: She is alert. She has normal motor skills, normal sensation, normal strength, normal reflexes and intact cranial nerves.  Skin: Skin is warm and dry. She is not diaphoretic.  Psychiatric: Mood and affect normal.  Nursing note and vitals reviewed.     Assessment & Plan  Problem List Items Addressed This Visit    None    Visit Diagnoses    Myalgia    -  Primary   Relevant Orders   Thyroid Panel With TSH   CK (Creatine Kinase)   Renal Function Panel   Ambulatory referral to Neurology   Muscle weakness       Relevant Orders   Thyroid Panel With TSH   CK (Creatine Kinase)   Renal Function Panel   Ambulatory referral to Neurology     I spent 25 minutes with this patient, More than 50% of that time was spent in face to face education, counseling and care coordination.25   Dr. Hayden Rasmusseneanna Deyon Chizek [facility]  06/11/16

## 2016-06-12 LAB — RENAL FUNCTION PANEL
ALBUMIN: 4.3 g/dL (ref 3.5–5.5)
BUN / CREAT RATIO: 13 (ref 9–23)
BUN: 9 mg/dL (ref 6–20)
CO2: 22 mmol/L (ref 18–29)
CREATININE: 0.7 mg/dL (ref 0.57–1.00)
Calcium: 8.8 mg/dL (ref 8.7–10.2)
Chloride: 103 mmol/L (ref 96–106)
GFR, EST AFRICAN AMERICAN: 134 mL/min/{1.73_m2} (ref >59)
GFR, EST NON AFRICAN AMERICAN: 116 mL/min/{1.73_m2} (ref >59)
Glucose: 94 mg/dL (ref 65–99)
Phosphorus: 3.1 mg/dL (ref 2.5–4.5)
Potassium: 4.2 mmol/L (ref 3.5–5.2)
Sodium: 142 mmol/L (ref 134–144)

## 2016-06-12 LAB — THYROID PANEL WITH TSH
FREE THYROXINE INDEX: 1.9 (ref 1.2–4.9)
T3 Uptake Ratio: 27 % (ref 24–39)
T4 TOTAL: 7.1 ug/dL (ref 4.5–12.0)
TSH: 0.789 u[IU]/mL (ref 0.450–4.500)

## 2016-06-12 LAB — CK: Total CK: 58 U/L (ref 24–173)

## 2016-06-19 ENCOUNTER — Other Ambulatory Visit (HOSPITAL_COMMUNITY)
Admission: RE | Admit: 2016-06-19 | Discharge: 2016-06-19 | Disposition: A | Discharge status: Home / Self Care | Payer: 59 | Source: Ambulatory Visit | Attending: Radiology | Admitting: Radiology

## 2016-06-19 ENCOUNTER — Ambulatory Visit
Admission: RE | Admit: 2016-06-19 | Discharge: 2016-06-19 | Disposition: A | Discharge status: Home / Self Care | Payer: 59 | Source: Ambulatory Visit | Attending: Otolaryngology | Admitting: Otolaryngology

## 2016-06-19 DIAGNOSIS — E041 Nontoxic single thyroid nodule: Secondary | ICD-10-CM

## 2016-07-28 ENCOUNTER — Encounter (HOSPITAL_COMMUNITY): Payer: Self-pay | Admitting: *Deleted

## 2016-07-28 ENCOUNTER — Inpatient Hospital Stay (HOSPITAL_COMMUNITY)
Admission: AD | Admit: 2016-07-28 | Discharge: 2016-07-29 | Disposition: A | Discharge status: Home / Self Care | Payer: 59 | Source: Ambulatory Visit | Attending: Family Medicine | Admitting: Family Medicine

## 2016-07-28 DIAGNOSIS — K219 Gastro-esophageal reflux disease without esophagitis: Secondary | ICD-10-CM | POA: Diagnosis not present

## 2016-07-28 DIAGNOSIS — Z3A01 Less than 8 weeks gestation of pregnancy: Secondary | ICD-10-CM | POA: Diagnosis not present

## 2016-07-28 DIAGNOSIS — O99611 Diseases of the digestive system complicating pregnancy, first trimester: Secondary | ICD-10-CM | POA: Insufficient documentation

## 2016-07-28 DIAGNOSIS — A084 Viral intestinal infection, unspecified: Secondary | ICD-10-CM | POA: Insufficient documentation

## 2016-07-28 LAB — URINALYSIS, ROUTINE W REFLEX MICROSCOPIC
Glucose, UA: NEGATIVE mg/dL
KETONES UR: 40 mg/dL — AB
NITRITE: NEGATIVE
Protein, ur: 30 mg/dL — AB
Specific Gravity, Urine: 1.025 (ref 1.005–1.030)
pH: 6.5 (ref 5.0–8.0)

## 2016-07-28 LAB — URINALYSIS, MICROSCOPIC (REFLEX)

## 2016-07-28 LAB — POCT PREGNANCY, URINE: PREG TEST UR: POSITIVE — AB

## 2016-07-28 MED ORDER — PROMETHAZINE HCL 25 MG/ML IJ SOLN
25.0000 mg | Freq: Once | INTRAMUSCULAR | Status: AC
  Administered 2016-07-28: 25 mg via INTRAVENOUS
  Filled 2016-07-28: qty 1

## 2016-07-28 NOTE — MAU Provider Note (Signed)
  History     CSN: [id number]  Arrival date and time: 07/28/16 2041   First Provider Initiated Contact with Patient 07/28/16 2301      Chief Complaint  Patient presents with  . Emesis   Emesis   This is a new problem. The current episode started yesterday. The problem occurs more than 10 times per day. The problem has been unchanged. The emesis has an appearance of stomach contents. There has been no fever. Associated symptoms include diarrhea. Pertinent negatives include no chills or fever. Risk factors include ill contacts. She has tried nothing for the symptoms.    Past Medical History:  Diagnosis Date  . 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 Dysrhythmia    hx of tachycardia  . GERD (gastroesophageal reflux disease)   . PP care, s/p SVD 12/24 08/04/2011    Past Surgical History:  Procedure Laterality Date  . THYROIDECTOMY, PARTIAL      Family History  Problem Relation Age of Onset  . Cancer Mother     melanoma  . Kidney disease Maternal Grandfather     chronic renal failure  . Diabetes Maternal Grandfather   . Heart disease Maternal Grandfather   . Hypertension Paternal Grandmother   . Peripheral vascular disease Paternal Grandmother   . COPD Paternal Grandfather   . Diabetes Paternal Grandfather   . Hypertension Paternal Grandfather   . Heart disease Paternal Grandfather     Social History  Substance Use Topics  . Smoking status: Never Smoker  . Smokeless tobacco: Never Used  . Alcohol use No    Allergies: No Known Allergies  No prescriptions prior to admission.    Review of Systems  Constitutional: Negative for chills and fever.  Gastrointestinal: Positive for diarrhea, nausea and vomiting.  Genitourinary: Negative for dysuria, frequency and urgency.   Physical Exam   Blood pressure 108/71, pulse 112, temperature 98.5 F (36.9 C), temperature source Oral, resp. rate 18, height 5\' 9"  (1.753 m), weight 163 lb 12 oz (74.3 kg), unknown  if currently breastfeeding.  Physical Exam  Nursing note and vitals reviewed. Constitutional: She appears well-developed and well-nourished. No distress.  HENT:  Head: Normocephalic.  Cardiovascular: Normal rate.   Respiratory: Effort normal.  GI: Soft. There is no tenderness. There is no rebound.  Neurological: She is alert.  Skin: Skin is warm and dry.  Psychiatric: She has a normal mood and affect.    MAU Course  Procedures  MDM Patient has had 1L of D5LR with phenergan and 1L of LR. She reports feeling better, and it tolerating PO.   Assessment and Plan   1. Viral gastroenteritis   2. [redacted] weeks gestation of pregnancy    DC home Comfort measures reviewed  1st trimester precautions  RX: Phenergan PRN #[id number]  Return to MAU as needed FU with OB as planned  Follow-up Information    [facility] Follow up.   Contact information: [address] [place] [id number]            Tawnya CrookHogan, Fortunata Betty Donovan 07/28/2016, 11:03 PM

## 2016-07-28 NOTE — MAU Note (Signed)
PT  SAYS SHE TOOK HPT   2 WEEKS  AGO-  POSITIVE.    HAD AN APPOINTMENT  TODAY  WITH  DR  Ernestina PennaFOGLEMAN -      DID NOT  GO.     SHE STARTED  VOMITING  8 PM  YESTERDAY. .    2 LOOSE   BM'S.   ALL FAMILY   HAS HAD  VIRUS.     AN OCC    CRAMP

## 2016-07-28 NOTE — MAU Note (Signed)
Pt states that her entire family has had stomach virus and that she started vomiting around 8pm last night. Is unable to keep anything. Had 2 episodes of loose stool today. Denies pain or vag bleeding. LMP: 06/10/2016. States she had an appt today for labs but did not go because she was sick. Was called in Diclegis today-took around 4pm. Has not helped.

## 2016-07-29 DIAGNOSIS — Z3A01 Less than 8 weeks gestation of pregnancy: Secondary | ICD-10-CM | POA: Diagnosis not present

## 2016-07-29 DIAGNOSIS — A084 Viral intestinal infection, unspecified: Secondary | ICD-10-CM | POA: Diagnosis not present

## 2016-07-29 DIAGNOSIS — O99611 Diseases of the digestive system complicating pregnancy, first trimester: Secondary | ICD-10-CM

## 2016-07-29 MED ORDER — LACTATED RINGERS IV BOLUS (SEPSIS)
1000.0000 mL | Freq: Once | INTRAVENOUS | Status: AC
  Administered 2016-07-29: 1000 mL via INTRAVENOUS

## 2016-07-29 MED ORDER — PROMETHAZINE HCL 25 MG PO TABS: 12.5000 mg | ORAL_TABLET | Freq: Four times a day (QID) | ORAL | 0 refills | Status: DC | PRN

## 2016-07-29 NOTE — Discharge Instructions (Signed)

## 2016-08-11 NOTE — L&D Delivery Note (Addendum)
Delivery Note   Melissa PinaRussell, Melissa Stark [[id number]][[id number]]  At 1:46 PM a viable and healthy female was delivered via  (Presentation: LOA ).  APGAR: 8, 9; weight  pending.   Placenta status: spontaneous, intact.  Cord:  with the following complications: noe .  Anesthesia:  epidural Episiotomy:  na Lacerations:  na Suture Repair: na Est. Blood Loss (mL):  250    Melissa Stark, Melissa Stark [[id number]][[id number]]  At  a viable and healthy female was delivered via  (Presentation: footling breech).  Picard maneuver. M-S-V maneuver.APGAR: 8,,9 weight  pending.  Sono guided. Attempted ECV - unsuccessful. Internal podalic version. Placenta status: spontaneous, intact.  Cord:  with the following complications:none .  Anesthesia:  epidural Episiotomy:  na Lacerations:  na Suture Repair: na Est. Blood Loss (mL):  250   Mom to postpartum.   Baby A to Couplet care / Skin to Skin.   Baby B to Couplet care / Skin to Skin.  Melissa Stark 02/22/2017, 2:02 PM

## 2016-12-30 LAB — OB RESULTS CONSOLE GC/CHLAMYDIA
CHLAMYDIA, DNA PROBE: NEGATIVE
GC PROBE AMP, GENITAL: NEGATIVE

## 2016-12-30 LAB — OB RESULTS CONSOLE HIV ANTIBODY (ROUTINE TESTING): HIV: NONREACTIVE

## 2016-12-30 LAB — OB RESULTS CONSOLE HEPATITIS B SURFACE ANTIGEN: HEP B S AG: NEGATIVE

## 2016-12-30 LAB — OB RESULTS CONSOLE RUBELLA ANTIBODY, IGM: Rubella: IMMUNE

## 2016-12-30 LAB — OB RESULTS CONSOLE RPR: RPR: NONREACTIVE

## 2017-02-12 ENCOUNTER — Inpatient Hospital Stay (HOSPITAL_COMMUNITY): Payer: 59

## 2017-02-12 ENCOUNTER — Encounter (HOSPITAL_COMMUNITY): Payer: Self-pay | Admitting: *Deleted

## 2017-02-12 ENCOUNTER — Inpatient Hospital Stay (HOSPITAL_COMMUNITY)
Admission: AD | Admit: 2017-02-12 | Discharge: 2017-02-12 | Disposition: A | Discharge status: Home / Self Care | Payer: 59 | Source: Ambulatory Visit | Attending: Obstetrics and Gynecology | Admitting: Obstetrics and Gynecology

## 2017-02-12 DIAGNOSIS — O30003 Twin pregnancy, unspecified number of placenta and unspecified number of amniotic sacs, third trimester: Secondary | ICD-10-CM

## 2017-02-12 DIAGNOSIS — O26893 Other specified pregnancy related conditions, third trimester: Secondary | ICD-10-CM | POA: Insufficient documentation

## 2017-02-12 DIAGNOSIS — Z3A35 35 weeks gestation of pregnancy: Secondary | ICD-10-CM | POA: Diagnosis not present

## 2017-02-12 DIAGNOSIS — O30043 Twin pregnancy, dichorionic/diamniotic, third trimester: Secondary | ICD-10-CM | POA: Insufficient documentation

## 2017-02-12 DIAGNOSIS — O288 Other abnormal findings on antenatal screening of mother: Secondary | ICD-10-CM

## 2017-02-12 LAB — OB RESULTS CONSOLE GBS: GBS: NEGATIVE

## 2017-02-12 NOTE — MAU Note (Signed)
Urine in the lab  

## 2017-02-12 NOTE — MAU Note (Signed)
Pt sent from MD office, was having uc's on monitor, twins, SVE 2cm's, was closed last week.  No bleeding or LOF.  Twin pregnancy.

## 2017-02-12 NOTE — Discharge Instructions (Signed)
Labor precautions

## 2017-02-12 NOTE — Progress Notes (Signed)
History     Chief Complaint  Patient presents with  . Contractions   31 yo [id number] MWF with Di-DI twin gestation @ 35 2/7 weeks sent from the office to r/o PTL and  Further eval of  NST x 2 NR. Per Dr Amado Nash pt was 2 cm dilated. She is vtx/breech. BMZ given today  OB History    Gravida Para Term Preterm AB Living   3 2 2     2    SAB TAB Ectopic Multiple Live Births         0 2      Past Medical History:  Diagnosis Date  . 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 Dysrhythmia    hx of tachycardia  . GERD (gastroesophageal reflux disease)   . PP care, s/p SVD 12/24 08/04/2011    Past Surgical History:  Procedure Laterality Date  . THYROIDECTOMY, PARTIAL      Family History  Problem Relation Age of Onset  . Cancer Mother        melanoma  . Kidney disease Maternal Grandfather        chronic renal failure  . Diabetes Maternal Grandfather   . Heart disease Maternal Grandfather   . Hypertension Paternal Grandmother   . Peripheral vascular disease Paternal Grandmother   . COPD Paternal Grandfather   . Diabetes Paternal Grandfather   . Hypertension Paternal Grandfather   . Heart disease Paternal Grandfather     Social History  Substance Use Topics  . Smoking status: Never Smoker  . Smokeless tobacco: Never Used  . Alcohol use No    Allergies: No Known Allergies  Prescriptions Prior to Admission  Medication Sig Dispense Refill Last Dose  . promethazine (PHENERGAN) 25 MG tablet Take 0.5-1 tablets (12.5-25 mg total) by mouth every 6 (six) hours as needed. 30 tablet 0      Physical Exam   Blood pressure 125/79, pulse (!) 111, temperature 98.9 F (37.2 C), temperature source Oral, resp. rate (!) 98, last menstrual period 06/11/2016, unknown if currently breastfeeding. WDWN MWF in NAD Abdomen: gravid nontender Pelvic: external genitalia normal and gravid cervix FT/thick/post/-2 Extremities: edema tr   NST x2   Reactive ( twin A: baseline 150(+)  accel 165 Twin B: baseline 155-160 (+) accel  Uterine irritability ED Course  IMP: NST NR x 2 Twin gestation P) BPP x 2.  MDM [place] Mfm Fetal Bpp Wo Non Stress  Result Date: 02/13/2017 ----------------------------------------------------------------------  OBSTETRICS REPORT                      (Signed Final 02/13/2017 08:27 am) ---------------------------------------------------------------------- Patient Info  ID #:       [id number]                         D.O.B.:   [birth year] (31 yrs)  Name:       Melissa Stark               Visit Date:  02/12/2017 08:09 pm ---------------------------------------------------------------------- Performed By  Performed By:     Ellin Saba        Ref. Address:     [facility] [employer]                    [facility]                                                             [facility]                                                             [address]                                                             [place], [place]                                                             [address]  Attending:        Ledon Snare MD         Secondary Phy.:   MAU Nursing-                                                             MAU/Triage  Referred By:      Nena Jordan             Location:         [facility]                    Jameis Newsham MD ---------------------------------------------------------------------- Orders   #  Description                                 Code   1  [place] MFM FETAL BPP WO NON STRESS              76819.01   2  [place] MFM FETAL BPP WO NST ADDL                60454.0      GESTATION  ----------------------------------------------------------------------   #  Ordered By               Order #        Accession #    Episode #   1  Nena Jordan               [id number]      [id number]     [id number]      Britteney Ayotte   2  Omarion Minnehan               [id number]      [id number]     [id number]      Mazin Emma   ----------------------------------------------------------------------  Indications   [redacted] weeks gestation of pregnancy                Z3A.35   Preterm contractions                           O47.00   Twin pregnancy, third trimester  unknown       O30.043   chorionicity  ---------------------------------------------------------------------- OB History  Gravidity:    3         Term:   2  Living:       2 ---------------------------------------------------------------------- Fetal Evaluation (Fetus A)  Num Of Fetuses:     2  Fetal Heart         155  Rate(bpm):  Cardiac Activity:   Observed  Fetal Lie:          Left Fetus  Presentation:       Cephalic  Amniotic Fluid  AFI FV:      Subjectively within normal limits                              Largest Pocket(cm)                              3.2 ---------------------------------------------------------------------- Biophysical Evaluation (Fetus A)  Amniotic F.V:   Within normal limits       F. Tone:        Observed  F. Movement:    Observed                   Score:          8/8  F. Breathing:   Observed ---------------------------------------------------------------------- Gestational Age (Fetus A)  Clinical EDD:  35w 2d                                        EDD:   03/17/17  Best:          35w 2d    Det. By:   Clinical EDD             EDD:   03/17/17 ---------------------------------------------------------------------- Anatomy (Fetus A)  Stomach:               Appears normal, left                         sided ---------------------------------------------------------------------- Fetal Evaluation (Fetus B)  Num Of Fetuses:     2  Fetal Heart         144  Rate(bpm):  Cardiac Activity:   Observed  Fetal Lie:          Upper Fetus  Presentation:       Transverse, head to maternal left  Amniotic Fluid  AFI FV:      Subjectively within normal limits                              Largest Pocket(cm)                              3.47  ---------------------------------------------------------------------- Biophysical Evaluation (Fetus B)  Amniotic F.V:   Within normal limits  F. Tone:        Observed  F. Movement:    Observed                   Score:          8/8  F. Breathing:   Observed ---------------------------------------------------------------------- Gestational Age (Fetus B)  Clinical EDD:  35w 2d                                        EDD:   03/17/17  Best:          35w 2d    Det. By:   Clinical EDD             EDD:   03/17/17 ---------------------------------------------------------------------- Anatomy (Fetus B)  Stomach:               Appears normal, left   Bladder:                Appears normal                         sided ---------------------------------------------------------------------- Cervix Uterus Adnexa  Cervix  Not visualized (advanced GA >29wks) ---------------------------------------------------------------------- Impression  Di/di twin pregnancy (by prenatal record notes) at 35 weeks 2  days gestation with fetal cardiac activity x2  Cephalic /transverse presentation  BPP 8/8 x2 with adequate amniotic fluid x2 ---------------------------------------------------------------------- Recommendations  Follow-up ultrasounds and antenatal testing as clinically  indicated. ----------------------------------------------------------------------                   Ledon Snare, MD Electronically Signed Final Report   02/13/2017 08:27 am ----------------------------------------------------------------------  [place] Mfm Fetal Bpp Wo Nst Addl Gestation  Result Date: 02/13/2017 ----------------------------------------------------------------------  OBSTETRICS REPORT                      (Signed Final 02/13/2017 08:27 am) ---------------------------------------------------------------------- Patient Info  ID #:       [id number]                         D.O.B.:   12-22-1985 (31 yrs)  Name:       Melissa Stark               Visit Date:   02/12/2017 08:09 pm ---------------------------------------------------------------------- Performed By  Performed By:     Ellin Saba        Ref. Address:     [facility]                    [facility]                                                             [facility]                                                             [facility]  [address]                                                             [place], [place]                                                             [id number]  Attending:        Ledon SnareBrian Brost MD         Secondary Phy.:   MAU Nursing-                                                             MAU/Triage  Referred By:      Nena JordanSHERONETTE             [facility]                    Royston Bekele MD ---------------------------------------------------------------------- Orders   #  Description                                 Code   1  US MFM FETAL BPP WO NON STRESS              76819.01   2  US MFM FETAL BPP WO NST ADDL                98119.176819.1      GESTATION  ----------------------------------------------------------------------   #  Ordered By               Order #        Accession #    Episode #   1  Nena JordanSHERONETTE               [id number]      [id number]     [id number]      Tamara Kenyon   2  Zerina Hallinan               [id number]      [id number]     [id number]      Eathan Groman  ---------------------------------------------------------------------- Indications   [redacted] weeks gestation of pregnancy                Z3A.35   Preterm contractions                           O47.00   Twin pregnancy, third trimester  unknown       O30.043   chorionicity  ---------------------------------------------------------------------- OB History  Gravidity:    3         Term:   2  Living:       2 ---------------------------------------------------------------------- Fetal Evaluation (Fetus A)  Num Of Fetuses:     2  Fetal Heart  155  Rate(bpm):   Cardiac Activity:   Observed  Fetal Lie:          Left Fetus  Presentation:       Cephalic  Amniotic Fluid  AFI FV:      Subjectively within normal limits                              Largest Pocket(cm)                              3.2 ---------------------------------------------------------------------- Biophysical Evaluation (Fetus A)  Amniotic F.V:   Within normal limits       F. Tone:        Observed  F. Movement:    Observed                   Score:          8/8  F. Breathing:   Observed ---------------------------------------------------------------------- Gestational Age (Fetus A)  Clinical EDD:  35w 2d                                        EDD:   03/17/17  Best:          35w 2d    Det. By:   Clinical EDD             EDD:   03/17/17 ---------------------------------------------------------------------- Anatomy (Fetus A)  Stomach:               Appears normal, left                         sided ---------------------------------------------------------------------- Fetal Evaluation (Fetus B)  Num Of Fetuses:     2  Fetal Heart         144  Rate(bpm):  Cardiac Activity:   Observed  Fetal Lie:          Upper Fetus  Presentation:       Transverse, head to maternal left  Amniotic Fluid  AFI FV:      Subjectively within normal limits                              Largest Pocket(cm)                              3.47 ---------------------------------------------------------------------- Biophysical Evaluation (Fetus B)  Amniotic F.V:   Within normal limits       F. Tone:        Observed  F. Movement:    Observed                   Score:          8/8  F. Breathing:   Observed ---------------------------------------------------------------------- Gestational Age (Fetus B)  Clinical EDD:  35w 2d                                        EDD:   03/17/17  Best:          35w 2d    Det. By:  Clinical EDD             EDD:   03/17/17 ---------------------------------------------------------------------- Anatomy (Fetus B)   Stomach:               Appears normal, left   Bladder:                Appears normal                         sided ---------------------------------------------------------------------- Cervix Uterus Adnexa  Cervix  Not visualized (advanced GA >29wks) ---------------------------------------------------------------------- Impression  Di/di twin pregnancy (by prenatal record notes) at 35 weeks 2  days gestation with fetal cardiac activity x2  Cephalic /transverse presentation  BPP 8/8 x2 with adequate amniotic fluid x2 ---------------------------------------------------------------------- Recommendations  Follow-up ultrasounds and antenatal testing as clinically  indicated. ----------------------------------------------------------------------                   Ledon Snare, MD Electronically Signed Final Report   02/13/2017 08:27 am ---------------------------------------------------------------------- reassuring fetal assessment No evidence of preterm labor D/c home Keep OB appt tomorrow for 2nd BMZ. PTL prec Shamecka Hocutt A, MD 7:29 PM 02/12/2017

## 2017-02-18 ENCOUNTER — Other Ambulatory Visit: Payer: Self-pay | Admitting: Obstetrics and Gynecology

## 2017-02-22 ENCOUNTER — Encounter (HOSPITAL_COMMUNITY): Payer: Self-pay

## 2017-02-22 ENCOUNTER — Inpatient Hospital Stay (HOSPITAL_COMMUNITY): Payer: 59 | Admitting: Anesthesiology

## 2017-02-22 ENCOUNTER — Inpatient Hospital Stay (HOSPITAL_COMMUNITY)
Admission: AD | Admit: 2017-02-22 | Discharge: 2017-02-24 | DRG: 775 | Disposition: A | Discharge status: Home / Self Care | Payer: 59 | Source: Ambulatory Visit | Attending: Obstetrics and Gynecology | Admitting: Obstetrics and Gynecology

## 2017-02-22 DIAGNOSIS — Z3A36 36 weeks gestation of pregnancy: Secondary | ICD-10-CM

## 2017-02-22 DIAGNOSIS — Z6791 Unspecified blood type, Rh negative: Secondary | ICD-10-CM | POA: Diagnosis not present

## 2017-02-22 DIAGNOSIS — D62 Acute posthemorrhagic anemia: Secondary | ICD-10-CM | POA: Diagnosis not present

## 2017-02-22 DIAGNOSIS — O9081 Anemia of the puerperium: Secondary | ICD-10-CM | POA: Diagnosis not present

## 2017-02-22 DIAGNOSIS — O9962 Diseases of the digestive system complicating childbirth: Secondary | ICD-10-CM | POA: Diagnosis present

## 2017-02-22 DIAGNOSIS — O30049 Twin pregnancy, dichorionic/diamniotic, unspecified trimester: Secondary | ICD-10-CM | POA: Diagnosis present

## 2017-02-22 DIAGNOSIS — K219 Gastro-esophageal reflux disease without esophagitis: Secondary | ICD-10-CM | POA: Diagnosis present

## 2017-02-22 DIAGNOSIS — O26893 Other specified pregnancy related conditions, third trimester: Secondary | ICD-10-CM | POA: Diagnosis present

## 2017-02-22 DIAGNOSIS — F419 Anxiety disorder, unspecified: Secondary | ICD-10-CM | POA: Diagnosis present

## 2017-02-22 DIAGNOSIS — O99344 Other mental disorders complicating childbirth: Secondary | ICD-10-CM | POA: Diagnosis present

## 2017-02-22 DIAGNOSIS — O30043 Twin pregnancy, dichorionic/diamniotic, third trimester: Secondary | ICD-10-CM | POA: Diagnosis present

## 2017-02-22 DIAGNOSIS — O26899 Other specified pregnancy related conditions, unspecified trimester: Secondary | ICD-10-CM

## 2017-02-22 DIAGNOSIS — O328XX2 Maternal care for other malpresentation of fetus, fetus 2: Principal | ICD-10-CM | POA: Diagnosis present

## 2017-02-22 LAB — CBC
HEMATOCRIT: 31 % — AB (ref 36.0–46.0)
HEMOGLOBIN: 10.5 g/dL — AB (ref 12.0–15.0)
MCH: 30.5 pg (ref 26.0–34.0)
MCHC: 33.9 g/dL (ref 30.0–36.0)
MCV: 90.1 fL (ref 78.0–100.0)
Platelets: 201 10*3/uL (ref 150–400)
RBC: 3.44 MIL/uL — AB (ref 3.87–5.11)
RDW: 13.4 % (ref 11.5–15.5)
WBC: 9.1 10*3/uL (ref 4.0–10.5)

## 2017-02-22 MED ORDER — OXYTOCIN 40 UNITS IN LACTATED RINGERS INFUSION - SIMPLE MED
2.5000 [IU]/h | INTRAVENOUS | Status: DC
  Administered 2017-02-22: 2.5 [IU]/h via INTRAVENOUS

## 2017-02-22 MED ORDER — DIPHENHYDRAMINE HCL 50 MG/ML IJ SOLN: 12.5000 mg | INTRAMUSCULAR | Status: DC | PRN

## 2017-02-22 MED ORDER — LACTATED RINGERS IV SOLN: 500.0000 mL | Freq: Once | INTRAVENOUS | Status: DC

## 2017-02-22 MED ORDER — FENTANYL 2.5 MCG/ML BUPIVACAINE 1/10 % EPIDURAL INFUSION (WH - ANES)
14.0000 mL/h | INTRAMUSCULAR | Status: DC | PRN
  Administered 2017-02-22 (×2): 14 mL/h via EPIDURAL
  Filled 2017-02-22: qty 100

## 2017-02-22 MED ORDER — ONDANSETRON HCL 4 MG/2ML IJ SOLN: 4.0000 mg | INTRAMUSCULAR | Status: DC | PRN

## 2017-02-22 MED ORDER — COCONUT OIL OIL: 1.0000 "application " | TOPICAL_OIL | Status: DC | PRN

## 2017-02-22 MED ORDER — LIDOCAINE HCL (PF) 1 % IJ SOLN
INTRAMUSCULAR | Status: DC | PRN
  Administered 2017-02-22 (×2): 5 mL

## 2017-02-22 MED ORDER — LACTATED RINGERS IV SOLN: 500.0000 mL | INTRAVENOUS | Status: DC | PRN

## 2017-02-22 MED ORDER — OXYCODONE-ACETAMINOPHEN 5-325 MG PO TABS: 2.0000 | ORAL_TABLET | ORAL | Status: DC | PRN

## 2017-02-22 MED ORDER — WITCH HAZEL-GLYCERIN EX PADS: 1.0000 "application " | MEDICATED_PAD | CUTANEOUS | Status: DC | PRN

## 2017-02-22 MED ORDER — BENZOCAINE-MENTHOL 20-0.5 % EX AERO: 1.0000 "application " | INHALATION_SPRAY | CUTANEOUS | Status: DC | PRN

## 2017-02-22 MED ORDER — PHENYLEPHRINE 40 MCG/ML (10ML) SYRINGE FOR IV PUSH (FOR BLOOD PRESSURE SUPPORT)
80.0000 ug | PREFILLED_SYRINGE | INTRAVENOUS | Status: DC | PRN
  Filled 2017-02-22: qty 5
  Filled 2017-02-22: qty 10

## 2017-02-22 MED ORDER — ONDANSETRON HCL 4 MG PO TABS: 4.0000 mg | ORAL_TABLET | ORAL | Status: DC | PRN

## 2017-02-22 MED ORDER — OXYTOCIN 40 UNITS IN LACTATED RINGERS INFUSION - SIMPLE MED
INTRAVENOUS | Status: AC
  Filled 2017-02-22: qty 1000

## 2017-02-22 MED ORDER — SENNOSIDES-DOCUSATE SODIUM 8.6-50 MG PO TABS
2.0000 | ORAL_TABLET | ORAL | Status: DC
  Administered 2017-02-23 (×2): 2 via ORAL
  Filled 2017-02-22 (×2): qty 2

## 2017-02-22 MED ORDER — METHYLERGONOVINE MALEATE 0.2 MG/ML IJ SOLN: 0.2000 mg | INTRAMUSCULAR | Status: DC | PRN

## 2017-02-22 MED ORDER — ZOLPIDEM TARTRATE 5 MG PO TABS: 5.0000 mg | ORAL_TABLET | Freq: Every evening | ORAL | Status: DC | PRN

## 2017-02-22 MED ORDER — EPHEDRINE 5 MG/ML INJ
10.0000 mg | INTRAVENOUS | Status: DC | PRN
  Filled 2017-02-22: qty 2

## 2017-02-22 MED ORDER — LACTATED RINGERS IV SOLN
INTRAVENOUS | Status: DC
  Administered 2017-02-22 (×2): via INTRAVENOUS

## 2017-02-22 MED ORDER — SIMETHICONE 80 MG PO CHEW: 80.0000 mg | CHEWABLE_TABLET | ORAL | Status: DC | PRN

## 2017-02-22 MED ORDER — LIDOCAINE HCL (PF) 1 % IJ SOLN
30.0000 mL | INTRAMUSCULAR | Status: DC | PRN
  Filled 2017-02-22: qty 30

## 2017-02-22 MED ORDER — SOD CITRATE-CITRIC ACID 500-334 MG/5ML PO SOLN: 30.0000 mL | ORAL | Status: DC | PRN

## 2017-02-22 MED ORDER — METHYLERGONOVINE MALEATE 0.2 MG PO TABS: 0.2000 mg | ORAL_TABLET | ORAL | Status: DC | PRN

## 2017-02-22 MED ORDER — IBUPROFEN 600 MG PO TABS
600.0000 mg | ORAL_TABLET | Freq: Four times a day (QID) | ORAL | Status: DC
  Administered 2017-02-22 – 2017-02-23 (×3): 600 mg via ORAL
  Filled 2017-02-22 (×3): qty 1

## 2017-02-22 MED ORDER — PHENYLEPHRINE 40 MCG/ML (10ML) SYRINGE FOR IV PUSH (FOR BLOOD PRESSURE SUPPORT)
PREFILLED_SYRINGE | INTRAVENOUS | Status: AC
  Filled 2017-02-22: qty 20

## 2017-02-22 MED ORDER — LIDOCAINE HCL (PF) 1 % IJ SOLN
INTRAMUSCULAR | Status: AC
  Filled 2017-02-22: qty 30

## 2017-02-22 MED ORDER — PRENATAL MULTIVITAMIN CH
1.0000 | ORAL_TABLET | Freq: Every day | ORAL | Status: DC
  Administered 2017-02-23: 1 via ORAL
  Filled 2017-02-22: qty 1

## 2017-02-22 MED ORDER — OXYTOCIN 40 UNITS IN LACTATED RINGERS INFUSION - SIMPLE MED
1.0000 m[IU]/min | INTRAVENOUS | Status: DC
  Administered 2017-02-22: 2 m[IU]/min via INTRAVENOUS

## 2017-02-22 MED ORDER — OXYTOCIN BOLUS FROM INFUSION
500.0000 mL | Freq: Once | INTRAVENOUS | Status: AC
  Administered 2017-02-22: 500 mL via INTRAVENOUS

## 2017-02-22 MED ORDER — TETANUS-DIPHTH-ACELL PERTUSSIS 5-2.5-18.5 LF-MCG/0.5 IM SUSP: 0.5000 mL | Freq: Once | INTRAMUSCULAR | Status: DC

## 2017-02-22 MED ORDER — ACETAMINOPHEN 325 MG PO TABS
650.0000 mg | ORAL_TABLET | ORAL | Status: DC | PRN
  Administered 2017-02-22: 650 mg via ORAL
  Filled 2017-02-22 (×2): qty 2

## 2017-02-22 MED ORDER — OXYCODONE-ACETAMINOPHEN 5-325 MG PO TABS
1.0000 | ORAL_TABLET | ORAL | Status: DC | PRN
  Administered 2017-02-23: 1 via ORAL
  Filled 2017-02-22: qty 1

## 2017-02-22 MED ORDER — DIPHENHYDRAMINE HCL 25 MG PO CAPS: 25.0000 mg | ORAL_CAPSULE | Freq: Four times a day (QID) | ORAL | Status: DC | PRN

## 2017-02-22 MED ORDER — PHENYLEPHRINE 40 MCG/ML (10ML) SYRINGE FOR IV PUSH (FOR BLOOD PRESSURE SUPPORT)
80.0000 ug | PREFILLED_SYRINGE | INTRAVENOUS | Status: DC | PRN
  Filled 2017-02-22: qty 5

## 2017-02-22 MED ORDER — DIBUCAINE 1 % RE OINT: 1.0000 "application " | TOPICAL_OINTMENT | RECTAL | Status: DC | PRN

## 2017-02-22 MED ORDER — ONDANSETRON HCL 4 MG/2ML IJ SOLN: 4.0000 mg | Freq: Four times a day (QID) | INTRAMUSCULAR | Status: DC | PRN

## 2017-02-22 MED ORDER — FENTANYL 2.5 MCG/ML BUPIVACAINE 1/10 % EPIDURAL INFUSION (WH - ANES)
INTRAMUSCULAR | Status: AC
  Filled 2017-02-22: qty 100

## 2017-02-22 MED ORDER — ACETAMINOPHEN 325 MG PO TABS: 650.0000 mg | ORAL_TABLET | ORAL | Status: DC | PRN

## 2017-02-22 MED ORDER — TERBUTALINE SULFATE 1 MG/ML IJ SOLN
0.2500 mg | Freq: Once | INTRAMUSCULAR | Status: DC | PRN
  Filled 2017-02-22: qty 1

## 2017-02-22 MED ORDER — LIDOCAINE-EPINEPHRINE (PF) 2 %-1:200000 IJ SOLN
INTRAMUSCULAR | Status: DC | PRN
  Administered 2017-02-22: 5 mL via INTRADERMAL

## 2017-02-22 NOTE — Progress Notes (Signed)
S:  Pt. Getting comfortable with epidural       Discussed AROM with pt. And husband and they agree  O:  VS: Blood pressure 132/86, pulse (!) 101, temperature 98.8 F (37.1 C), temperature source Oral, resp. rate 17, height 5\' 9"  (1.753 m), weight 92.5 kg (204 lb), last menstrual period 06/11/2016, SpO2 99 %, unknown if currently breastfeeding.        FHR  Twin A: baseline rate 145 bpm / variability minimal- moderate / accelerations + / no decels Twin B: baseline rate 140 bpm / variability minimal-moderate / accelerations + / no decelerations         Toco: contractions every 3-4 minutes / moderate         Cervix : Dilation: 4.5 Effacement (%): 80 Station: 0 Presentation: Vertex Exam by:: M,Sigmon, CNM        Membranes: AROM - clear, bloody show  A: Latent labor     FHR category 1     Di Di Twins     GBS Negative   P: Begin Pitocin at 2 milliunits and increase by 2 milliunits     Reassess in 1-2 hrs      Updated Dr. Billy Coastaavon - unsure if we will deliver in room or OR    Carlean JewsMeredith Sigmon, MSN, CNM [facility]

## 2017-02-22 NOTE — Progress Notes (Signed)
Melissa Stark is a 31 y.o. G3P2002 at 5869w5d by LMP admitted for active labor  Subjective: Feels contractions  Objective: BP 103/67   Pulse 93   Temp 98 F (36.7 C) (Oral)   Resp 18   Ht 5\' 9"  (1.753 m)   Wt 92.5 kg (204 lb)   LMP 06/11/2016 (Exact Date)   SpO2 100%   BMI 30.13 kg/m  No intake/output data recorded. No intake/output data recorded.  FHT: Fetus A- FHR: 145 bpm, variability: moderate,  accelerations:  Present,  decelerations:  Absent FHR B: 155 bpm, variability: moderate,  accelerations:  Present,  decelerations:  Absent  UC:   irregular, every 1-3 minutes SVE:   7/90/0  Labs: Lab Results  Component Value Date   WBC 9.1 02/22/2017   HGB 10.5 (L) 02/22/2017   HCT 31.0 (L) 02/22/2017   MCV 90.1 02/22/2017   PLT 201 02/22/2017    Assessment / Plan: Spontaneous labor, progressing normally- augmentation DIDI twins Cephalic /oblique  Labor: augmentation Preeclampsia:  no signs or symptoms of toxicity, intake and ouput balanced and labs stable Fetal Wellbeing:  Category I Pain Control:  Epidural I/D:  n/a Anticipated MOD:  NSVD  Melissa Stark J 02/22/2017, 12:46 PM

## 2017-02-22 NOTE — MAU Note (Signed)
Contractions since 0030 Q 3-618mins apart, denies bleeding or leakage of fluid

## 2017-02-22 NOTE — Progress Notes (Signed)
Melissa Stark is a 31 y.o. G3P2002 at 84100w5d by LMP admitted for active labor  Subjective: Getting more comfortable  Objective: BP 120/62   Pulse (!) 102   Temp 98.6 F (37 C) (Oral)   Resp 18   Ht 5\' 9"  (1.753 m)   Wt 92.5 kg (204 lb)   LMP 06/11/2016 (Exact Date)   SpO2 100%   BMI 30.13 kg/m  No intake/output data recorded. No intake/output data recorded.  FHT: A- FHR: 140 bpm, variability: moderate,  accelerations:  Present,  decelerations:  Absent  B- FHR: 150 bpm, variability: moderate,  accelerations:  Present,  decelerations:  Absent  UC:   irregular, every 2-4 minutes SVE:   Dilation: 5 Effacement (%): 80 Station: 0 Exam by:: Dr. Billy Coastaavon  Labs: Lab Results  Component Value Date   WBC 9.1 02/22/2017   HGB 10.5 (L) 02/22/2017   HCT 31.0 (L) 02/22/2017   MCV 90.1 02/22/2017   PLT 201 02/22/2017    Assessment / Plan: Spontaneous labor, progressing normally  DIDI twin gestation Vertex/ Right oblique presentation by bedside sono  Labor: Progressing normally Preeclampsia:  labs stable Fetal Wellbeing:  Category I Pain Control:  Epidural and IV pain meds I/D:  Anticipate SVD Anticipated MOD:  NSVD  [name] 02/22/2017, 10:44 AM

## 2017-02-22 NOTE — H&P (Signed)
OB ADMISSION/ HISTORY & PHYSICAL:  Admission Date: 02/22/2017  2:01 AM  Admit Diagnosis: Preterm labor with Melissa Stark  Chinwe Skip Mayer Melissa Stark is a 31 y.o. female G3P2002 at 36+[redacted] weeks gestation presenting for late preterm labor contractions that started around 1am.  She had slow cervical change, but increasing painful ctxs, and pt. Lives in [place] and was concerned to go home.  She was in triage for over 4 hours and cervical change occurred from 3.5 to 4cm.  She is s/p BMZ in the office.   Prenatal History: [id number]   EDC : 03/17/2017, Date entered prior to episode creation  [facility] 1st [facility]  Primary Ob Provider: Dr. 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 Dysrhythmia    hx of tachycardia  . GERD (gastroesophageal reflux disease)   . PP care, s/p SVD 12/24 08/04/2011     Past surgical history:  Past Surgical History:  Procedure Laterality Date  . THYROIDECTOMY, PARTIAL      Family History:  Family History  Problem Relation Age of Onset  . Cancer Mother        melanoma  . Kidney disease Maternal Grandfather        chronic renal failure  . Diabetes Maternal Grandfather   . Heart disease Maternal Grandfather   . Hypertension Paternal Grandmother   . Peripheral vascular disease Paternal Grandmother   . COPD Paternal Grandfather   . Diabetes Paternal Grandfather   . Hypertension Paternal Grandfather   . Heart disease Paternal Grandfather      Social  History:  reports that she has never smoked. She has never used smokeless tobacco. She reports that she does not drink alcohol or use drugs.   Allergies: Patient has no known allergies.    Current Medications at time of admission:  Prior to Admission medications   Not on File     Review of Systems: Active FM onset of ctx @ 1am currently every 4-5 minutes No LOF  / SROM  No bloody show   Physical Exam:  VS: Last menstrual period 06/11/2016, unknown if currently breastfeeding.  General: alert and oriented, appears slightly anxious, grimacing/breathing through ctxs Heart: RRR Lungs: Clear lung fields Abdomen: Gravid, soft and non-tender, non-distended / uterus: gravid, non-tender Extremities: +1 BLE pedal edema  Genitalia / VE: Dilation: 4 Effacement (%): 70 Station: -2 Exam by:: Sharyl NimrodMeredith S., cnm  FHR:  Twin A: baseline rate 140 bpm / variability minimal- moderate / accelerations + / occasional variable decelerations resolved Twin B: baseline rate 150 bpm / variability minimal-moderate / accelerations + / occasional variable decelerations TOCO: every 4 minutes  Assessment: 36+[redacted] weeks gestation Melissa Stark GBS Negative  Latent stage of labor FHR category 2 with progression to Category 1   Plan:  [facility]   - Routine labor and delivery orders   - Pain management: epidural  -  AROM when appropriate 2. GBS: Negative   - No prophylaxis indicated  3. Postpartum:   - Breast   - Contraception: unsure 4. Anticipate MOD: NSVD   - Proven pelvis: 7#7oz   Dr. Billy Coast notified of admission / plan of care  Carlean Jews, MSN, [facility]

## 2017-02-22 NOTE — Anesthesia Procedure Notes (Signed)
Epidural Patient location during procedure: OB  Staffing Anesthesiologist: Keira Bohlin Performed: anesthesiologist   Preanesthetic Checklist Completed: patient identified, site marked, surgical consent, pre-op evaluation, timeout performed, IV checked, risks and benefits discussed and monitors and equipment checked  Epidural Patient position: sitting Prep: DuraPrep Patient monitoring: heart rate, continuous pulse ox and blood pressure Approach: right paramedian Location: L3-L4 Injection technique: LOR saline  Needle:  Needle type: Tuohy  Needle gauge: 17 G Needle length: 9 cm and 9 Needle insertion depth: 5 cm Catheter type: closed end flexible Catheter size: 20 Guage Catheter at skin depth: 9 cm Test dose: negative  Assessment Events: blood not aspirated, injection not painful, no injection resistance, negative IV test and no paresthesia  Additional Notes Patient identified. Risks/Benefits/Options discussed with patient including but not limited to bleeding, infection, nerve damage, paralysis, failed block, incomplete pain control, headache, blood pressure changes, nausea, vomiting, reactions to medication both or allergic, itching and postpartum back pain. Confirmed with bedside nurse the patient's most recent platelet count. Confirmed with patient that they are not currently taking any anticoagulation, have any bleeding history or any family history of bleeding disorders. Patient expressed understanding and wished to proceed. All questions were answered. Sterile technique was used throughout the entire procedure. Please see nursing notes for vital signs. Test dose was given through epidural needle and negative prior to continuing to dose epidural or start infusion. Warning signs of high block given to the patient including shortness of breath, tingling/numbness in hands, complete motor block, or any concerning symptoms with instructions to call for help. Patient was given  instructions on fall risk and not to get out of bed. All questions and concerns addressed with instructions to call with any issues.     

## 2017-02-22 NOTE — Anesthesia Preprocedure Evaluation (Signed)

## 2017-02-23 LAB — CBC
HEMATOCRIT: 29.8 % — AB (ref 36.0–46.0)
HEMOGLOBIN: 10 g/dL — AB (ref 12.0–15.0)
MCH: 30.7 pg (ref 26.0–34.0)
MCHC: 33.6 g/dL (ref 30.0–36.0)
MCV: 91.4 fL (ref 78.0–100.0)
Platelets: 180 10*3/uL (ref 150–400)
RBC: 3.26 MIL/uL — ABNORMAL LOW (ref 3.87–5.11)
RDW: 13.4 % (ref 11.5–15.5)
WBC: 8.9 10*3/uL (ref 4.0–10.5)

## 2017-02-23 MED ORDER — NAPROXEN SODIUM 550 MG PO TABS
550.0000 mg | ORAL_TABLET | Freq: Two times a day (BID) | ORAL | Status: DC
Start: 2017-02-23 — End: 2017-02-24
  Administered 2017-02-23 – 2017-02-24 (×3): 550 mg via ORAL
  Filled 2017-02-23 (×5): qty 1

## 2017-02-23 MED ORDER — RHO D IMMUNE GLOBULIN 1500 UNIT/2ML IJ SOSY
300.0000 ug | PREFILLED_SYRINGE | Freq: Once | INTRAMUSCULAR | Status: AC
  Administered 2017-02-23: 300 ug via INTRAVENOUS
  Filled 2017-02-23: qty 2

## 2017-02-23 MED ORDER — IBUPROFEN 800 MG PO TABS: 800.0000 mg | ORAL_TABLET | Freq: Four times a day (QID) | ORAL | Status: DC

## 2017-02-23 NOTE — Lactation Note (Signed)
This note was copied from a baby's chart. Lactation Consultation Note Mom has 2 older children 2 1/32 yrs old and 805 yr old that she BF for 1 yr each. Denied difficulty excepts at the beginning learning to BF and latching. Mom has shells d/t short shaft nipples. Asked RN to give shells to evert mom's everted short shaft nipples more. Hand expression w/some colostrum. Mom has a hard time expressing. Gave a vial for mom to collect colostrum in.  Reviewed LPI information sheet, supplementing and feeding behavior of LPI.  Discussed pumping, Mom shown how to use DEBP & how to disassemble, clean, & reassemble parts. Mom knows to pump q3h for 15-20 min. Mom encouraged to feed baby 8-12 times/24 hours and with feeding cues. Wake babies up for feeding if hasn't cued to eat in 3 hrs. Alert RN if babies are hard to wake, not eating or having output.   Mom is to BF, then supplement, then pump. Until supplement amount increases, mom prefers to supplement w/curve tip syring.  Fed baby "A" w/curve tip syring 0.385ml colostrum then 6 ml Alimentum. Baby had a very TIGHT uncoordinated suck. Clamps down, mom stated she does that on the breast. Demonstrated suck training.   Gave mom bullets, syring, and stickers for labeling. FOB at bedside helping mom in care. WH/LC brochure given w/resources, support groups and LC services.  Patient Name: Melissa Stark ZOXWR'UToday's Date: 02/23/2017 Reason for consult: Initial assessment;Infant < 6lbs;Late preterm infant;Multiple gestation   Maternal Data Has patient been taught Hand Expression?: Yes Does the patient have breastfeeding experience prior to this delivery?: Yes  Feeding Feeding Type: Breast Milk with Formula added Length of feed: 2 min  LATCH Score/Interventions Latch: Repeated attempts needed to sustain latch, nipple held in mouth throughout feeding, stimulation needed to elicit sucking reflex. Intervention(s): Adjust position;Assist with latch;Breast  massage;Breast compression  Audible Swallowing: None Intervention(s): Skin to skin;Hand expression  Type of Nipple: Everted at rest and after stimulation  Comfort (Breast/Nipple): Soft / non-tender     Hold (Positioning): Assistance needed to correctly position infant at breast and maintain latch. Intervention(s): Breastfeeding basics reviewed;Support Pillows;Position options;Skin to skin  LATCH Score: 6  Lactation Tools Discussed/Used Tools: Pump Breast pump type: Double-Electric Breast Pump Pump Review: Setup, frequency, and cleaning;Milk Storage Initiated by:: Peri JeffersonL. Tad Fancher RN IBCLC Date initiated:: 02/23/17   Consult Status Consult Status: Follow-up Date: 02/23/17 (in pm) Follow-up type: In-patient    [name], Diamond NickelLAURA G 02/23/2017, 3:29 AM

## 2017-02-23 NOTE — Lactation Note (Signed)
This note was copied from a baby's chart. Lactation Consultation Note  Patient Name: Melissa Stark ZOXWR'UToday's Date: 02/23/2017 Reason for consult: Follow-up assessment;Multiple gestation  Follow up visit at 7932 hours of age.  Mom reports babies have been more fussy and thinks they might want more to eat.  LC encouraged mom to offer 20mls and slowly increase volumes.  LC encouraged mom to offer supplement with each feeding and then post pump. Mom is latching babies, some and reports B is not doing well with latching and baby A gets sleepy after a few minutes of good sucking.    LC discussed with mom possibly using a slow flow nipple for feedings as needed.  Mom reports she has been doing all the feedings and feels FOB can help more with bottle feedings, but mom is concerned about how it may affect latching.  LC encouraged mom to let us know how to best support her breastfeeding goals. LC discussed option of o/p appointments after discharge and that babies may do better closer to due date.   LC encouraged mom to double pump as she was only pumping one breast for some sessions.   Mom is tired and needed plan pulled together with support.  LC assisted with syringe feeding. Baby B is not sucking well with coordination during this feeding, but tolerated well. Baby A does well with sucking and passed large meconium after supplement.   Mom to call for assist as needed.     Maternal Data    Feeding Feeding Type: Formula  LATCH Score/Interventions                Intervention(s): Breastfeeding basics reviewed     Lactation Tools Discussed/Used     Consult Status Consult Status: Follow-up Date: 02/24/17 Follow-up type: In-patient    Jannifer RodneyShoptaw, Jana Lynn 02/23/2017, 10:15 PM

## 2017-02-23 NOTE — Progress Notes (Signed)
PPD 1 SVD-twins / intact - no repair   S:  Reports feeling tired with intense cramps more like labor pains             Tolerating po/ No nausea or vomiting             Bleeding is light             Pain minimally controlled with motrin and Percocet             Up ad lib / ambulatory / voiding QS  Newborn breast feeding  O:               VS: BP 126/88 (BP Location: Left Arm)   Pulse 90   Temp 98.3 F (36.8 C)   Resp 18   Ht 5\' 9"  (1.753 m)   Wt 92.5 kg (204 lb)   LMP 06/11/2016 (Exact Date)   SpO2 97%   Breastfeeding? Unknown   BMI 30.13 kg/m    LABS:              Recent Labs  02/22/17 0720 02/23/17 0548  WBC 9.1 8.9  HGB 10.5* 10.0*  PLT 201 180               Blood type: --/--/O NEG (07/15 0720) / newborns O positive - Rhophylac ordered  Rubella: Immune (05/22 0000)                     I&O: Intake/Output      07/15 0701 - 07/16 0700 07/16 0701 - 07/17 0700   Urine (mL/kg/hr) 300 (0.1)    Blood 250    Total Output 550     Net -550          Urine Occurrence 1 x                  Physical Exam:             Alert and oriented X3  Lungs: Clear and unlabored  Heart: regular rate and rhythm / no mumurs  Abdomen: soft, non-tender, non-distended              Fundus: firm, non-tender, Ueven  Perineum: intact without edema  Lochia: light  Extremities: no edema, no calf pain or tenderness    A: PPD # 1 SVD TWINS    Doing well - stable status  P: Routine post partum orders  Change analgesia to better control pain              Anticipate DC tomorrow - babies may remain 72 hours for Mom to room-in  Marlinda MikeBAILEY, Ivery Michalski CNM, MSN, [facility] 02/23/2017, 8:21 AM

## 2017-02-23 NOTE — Progress Notes (Signed)
[name] was referred for history of depression/anxiety. * Referral screened out by Clinical Social Worker because none of the following criteria appear to apply: ~ History of anxiety/depression during this pregnancy, or of post-partum depression. ~ Diagnosis of anxiety and/or depression within last 3 years OR * [name]'s symptoms currently being treated with medication and/or therapy.  [name] is currently taking Buspar.  Please contact the Clinical Social Worker if needs arise, or if [name] requests.  Fred Hammes Boyd-Gilyard, MSW, LCSW Clinical Social Work [phone]    

## 2017-02-23 NOTE — Anesthesia Postprocedure Evaluation (Signed)
Anesthesia Post Note  Patient: Heloise BeechamLauren N Lutze  Procedure(s) Performed: * No procedures listed *     Patient location during evaluation: Mother Baby Anesthesia Type: Epidural Level of consciousness: awake, awake and alert, oriented and patient cooperative Pain management: pain level controlled Vital Signs Assessment: post-procedure vital signs reviewed and stable Respiratory status: spontaneous breathing, nonlabored ventilation and respiratory function stable Cardiovascular status: stable Postop Assessment: no headache, no backache, patient able to bend at knees and no signs of nausea or vomiting Anesthetic complications: no    Last Vitals:  Vitals:   02/22/17 2115 02/23/17 0640  BP: 125/76 126/88  Pulse: (!) 112 90  Resp: 18 18  Temp: 37.2 C 36.8 C    Last Pain:  Vitals:   02/23/17 0720  TempSrc:   PainSc: Asleep   Pain Goal: Patients Stated Pain Goal: 0 (02/22/17 0215)               Novali Vollman L

## 2017-02-23 NOTE — Lactation Note (Signed)
This note was copied from a baby's chart. Lactation Consultation Note  Patient Name: Melissa Doren CustardLauren Stark RUEAV'WToday's Date: 02/23/2017  Mom was having lunch and requested lactation come back later.    Maternal Data    Feeding Feeding Type: Formula Length of feed: 10 min  LATCH Score/Interventions                      Lactation Tools Discussed/Used     Consult Status      Rulon Eisenmengerlizabeth E Anthoni Geerts 02/23/2017, 12:05 PM

## 2017-02-23 NOTE — Plan of Care (Signed)
Problem: Activity: Goal: Will verbalize the importance of balancing activity with adequate rest periods Outcome: Progressing Patient ambulating in hallway.

## 2017-02-24 LAB — RH IG WORKUP (INCLUDES ABO/RH)
ABO/RH(D): O NEG
FETAL SCREEN: NEGATIVE
GESTATIONAL AGE(WKS): 36.5
UNIT DIVISION: 0

## 2017-02-24 LAB — RPR: RPR Ser Ql: NONREACTIVE

## 2017-02-24 MED ORDER — NAPROXEN SODIUM 550 MG PO TABS
550.0000 mg | ORAL_TABLET | Freq: Two times a day (BID) | ORAL | 0 refills | Status: DC
Start: 2017-02-24 — End: 2019-06-26

## 2017-02-24 NOTE — Progress Notes (Signed)
PPD #2, SVD, Mercie Eoni Di Twin Girls "Arna Mediciora" and "Jae DireKate"  S:  Reports feeling good, tired, but minimal discomfort             Tolerating po/ No nausea or vomiting / Denies dizziness or SOB  No c/o anxiety or depression - reports she stopped taking Buspar in late 3rd trimester and has not had further problems             Bleeding is light             Pain controlled with Naproxen and Tylenol             Up ad lib / ambulatory / voiding QS  Newborn breast feeding / pumping with formula supplementation    O:               VS: BP 118/74 (BP Location: Right Arm)   Pulse 86   Temp 98.7 F (37.1 C) (Oral)   Resp 18   Ht 5\' 9"  (1.753 m)   Wt 92.5 kg (204 lb)   LMP 06/11/2016 (Exact Date)   SpO2 97%   Breastfeeding? Unknown   BMI 30.13 kg/m     LABS:              Recent Labs  02/22/17 0720 02/23/17 0548  WBC 9.1 8.9  HGB 10.5* 10.0*  PLT 201 180               Blood type: --/--/O NEG (07/16 0548)  Rubella: Immune (05/22 0000)                                 Physical Exam:             Alert and oriented X3  Lungs: Clear and unlabored  Heart: regular rate and rhythm / no mumurs  Abdomen: soft, non-tender, non-distended              Fundus: firm, non-tender, U-3  Perineum: intact, no significant swelling or erythema per patient (declined exam)  Lochia: appropriate, no clots today  Extremities: no edema, no calf pain or tenderness    A: PPD # 1  RH Negative, Babies are O Pos- s/p Rhogam on 7/16  ABL Anemia compounding Maternal IDA - stable, asymptomatic   Doing well - stable status  P: Routine post partum orders  WOB discharge book and instructions given  Peds may keep babies until tomorrow, and if so, mom will room-in tonight  Advised to take OTC iron supplement daily and continue prenatal vitamin  F/u with Dr. Billy Coastaavon in 6 weeks   Carlean JewsMeredith Sigmon, MSN, CNM [facility]

## 2017-02-24 NOTE — Progress Notes (Signed)
Post discharge chart review completed.  

## 2017-02-24 NOTE — Discharge Summary (Signed)
Obstetric Discharge Summary   Patient Name: Melissa Stark DOB: 08-11-1986 MRN: [id number]  Date of Admission: 02/22/2017 Date of Discharge: 02/24/2017 Date of Delivery: 02/22/17 Gestational Age at Delivery: [redacted]w[redacted]d  Primary OB: [facility] - Dr. 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 Twin pregnancy, twins dichorionic and diamniotic 02/22/2017  . SVD (spontaneous vaginal delivery): Mercie Eon Twins, vtx, double footling breech 02/22/2017  . Postpartum care following vaginal delivery (7/15) 02/22/2017  . Rh negative status during pregnancy 09/01/2014  . Acute blood loss anemia 09/01/2014  . Labor and delivery, indication for care 08/31/2014  . GENERALIZED ANXIETY DISORDER 06/19/2009  . PALPITATIONS 06/19/2009  . ABNORMAL ELECTROCARDIOGRAM 06/19/2009    Augmentation: AROM Complications: none  Date of Delivery: 02/22/17 Delivered By: Dr. Billy Coast Delivery Type: spontaneous vaginal delivery Twin A: vtx delivery  Twin B delivery: successful double footling breech delievery Anesthesia: epidural Placenta: sponatneous Laceration:  Episiotomy: none  Newborn Data:   Tenzin, Pavon [[id number]]  Live born female  Birth Weight: 5 lb 15.1 oz (2696 g) APGAR: 8, 9   Elene, Downum [[id number]]  Live born female  Birth Weight: 5 lb 11.4 oz (2591 g) APGAR: 7, 9  Postpartum Course  (Vaginal Delivery): Patient had an uncomplicated postpartum course.  By time of discharge on PPD#2, her pain  was controlled on oral pain medications; she had appropriate lochia and was ambulating, voiding without difficulty and tolerating regular diet.  She was deemed stable for discharge to home.     Labs: CBC Latest Ref Rng & Units 02/23/2017 02/22/2017 09/01/2014  WBC 4.0 - 10.5 K/uL 8.9 9.1 10.5  Hemoglobin 12.0 - 15.0 g/dL 10.0(L) 10.5(L) 9.7(L)  Hematocrit 36.0 - 46.0 % 29.8(L) 31.0(L) 28.7(L)  Platelets 150 - 400 K/uL 180 201 204   O NEG  Physical exam:  BP 118/74 (BP Location: Right Arm)   Pulse 86   Temp 98.7 F (37.1 C) (Oral)   Resp 18   Ht 5\' 9"  (1.753 m)   Wt 92.5 kg (204 lb)   LMP 06/11/2016 (Exact Date)   SpO2 97%   Breastfeeding? Unknown   BMI 30.13 kg/m  General: alert and no distress Pulm: normal respiratory effort Lochia: appropriate Abdomen: soft, NT Uterine Fundus: firm, below umbilicus Perineum: healing well, no significant erythema, no significant edema Extremities: No evidence of DVT seen on physical exam. No lower extremity edema.   Disposition: stable, discharge to home Baby Feeding: breast milk with formula supplementation Baby Disposition: will likely stay 72 hrs total, then home with mom  Contraception: vasectomy  Rh Immune globulin given: given on 02/23/17 Rubella vaccine given: N/A Tdap vaccine given in AP or PP setting: UTD Flu vaccine given in AP or PP setting: UTD   Plan:  Heloise Beecham was discharged to home in good condition. Follow-up appointment at [facility] in 6 weeks.  Discharge Instructions: Per After Visit Summary. Activity: Advance as tolerated. Pelvic rest for  6 weeks.  Refer to After Visit Summary Diet: Regular Discharge Medications: Allergies as of 02/24/2017   No Known Allergies     Medication List    STOP taking these medications   DICLEGIS 10-10 MG Tbec Generic drug:  Doxylamine-Pyridoxine   ranitidine 150 MG tablet Commonly known as:  ZANTAC     TAKE these medications   CALCIUM MAGNESIUM PO Take 1  tablet by mouth at bedtime.   naproxen sodium 550 MG tablet Commonly known as:  ANAPROX Take 1 tablet (550 mg total) by mouth 2 (two) times daily.      Outpatient follow up:  Follow-up Information    Olivia Mackieaavon, Richard, MD. Schedule an appointment as soon as possible for a visit in 6 week(s).   Specialty:  Obstetrics and Gynecology Why:  Postpartum visit  Contact information: [address] [place] [id number] [phone]            Signed:  Carlean JewsMeredith Sigmon, MSN, CNM [facility]

## 2017-02-26 LAB — TYPE AND SCREEN
ABO/RH(D): O NEG
Antibody Screen: POSITIVE
DAT, IgG: NEGATIVE
UNIT DIVISION: 0
Unit division: 0

## 2017-02-26 LAB — BPAM RBC
BLOOD PRODUCT EXPIRATION DATE: 201808012359
BLOOD PRODUCT EXPIRATION DATE: 201808162359
Unit Type and Rh: 9500
Unit Type and Rh: 9500

## 2017-03-04 ENCOUNTER — Inpatient Hospital Stay (HOSPITAL_COMMUNITY): Payer: 59

## 2018-04-27 ENCOUNTER — Encounter: Payer: 59 | Admitting: Family Medicine

## 2018-05-05 ENCOUNTER — Ambulatory Visit
Admission: RE | Admit: 2018-05-05 | Discharge: 2018-05-05 | Disposition: A | Discharge status: Home / Self Care | Payer: 59 | Source: Ambulatory Visit | Attending: Family Medicine | Admitting: Family Medicine

## 2018-05-05 ENCOUNTER — Encounter: Payer: Self-pay | Admitting: Family Medicine

## 2018-05-05 ENCOUNTER — Ambulatory Visit (INDEPENDENT_AMBULATORY_CARE_PROVIDER_SITE_OTHER): Payer: 59 | Admitting: Family Medicine

## 2018-05-05 VITALS — BP 120/64 | HR 72 | Ht 69.0 in | Wt 168.0 lb

## 2018-05-05 DIAGNOSIS — Z23 Encounter for immunization: Secondary | ICD-10-CM

## 2018-05-05 DIAGNOSIS — Z0289 Encounter for other administrative examinations: Secondary | ICD-10-CM

## 2018-05-05 DIAGNOSIS — R079 Chest pain, unspecified: Secondary | ICD-10-CM

## 2018-05-05 DIAGNOSIS — Z9889 Other specified postprocedural states: Secondary | ICD-10-CM | POA: Diagnosis not present

## 2018-05-05 DIAGNOSIS — M47894 Other spondylosis, thoracic region: Secondary | ICD-10-CM | POA: Insufficient documentation

## 2018-05-05 DIAGNOSIS — Z9009 Acquired absence of other part of head and neck: Secondary | ICD-10-CM

## 2018-05-05 DIAGNOSIS — E890 Postprocedural hypothyroidism: Secondary | ICD-10-CM

## 2018-05-05 NOTE — Progress Notes (Signed)
Date:  05/05/2018   Name:  Melissa Stark   DOB:  1986-05-27   MRN:  [id number]   Chief Complaint: Employment Physical (biometric form) Patient present for employment related Biometric Form.  Chest Pain   This is a new problem. The current episode started more than 1 year ago. The onset quality is gradual. The problem occurs every several days. The problem has been waxing and waning. Pain location: posterior,right, scapular. The pain is at a severity of 3/10. The pain is mild. The quality of the pain is described as dull. The pain does not radiate. Pertinent negatives include no abdominal pain, back pain, cough, dizziness, fever, headaches, nausea, numbness, shortness of breath, vomiting or weakness.   Review of Systems  Constitutional: Negative.  Negative for chills, fatigue, fever and unexpected weight change.  HENT: Negative for congestion, ear discharge, ear pain, rhinorrhea, sinus pressure, sneezing and sore throat.   Eyes: Negative for photophobia, pain, discharge, redness and itching.  Respiratory: Negative for cough, shortness of breath, wheezing and stridor.   Cardiovascular: Positive for chest pain.  Gastrointestinal: Negative for abdominal pain, blood in stool, constipation, diarrhea, nausea and vomiting.  Endocrine: Negative for cold intolerance, heat intolerance, polydipsia, polyphagia and polyuria.  Genitourinary: Negative for dysuria, flank pain, frequency, hematuria, menstrual problem, pelvic pain, urgency, vaginal bleeding and vaginal discharge.  Musculoskeletal: Negative for arthralgias, back pain and myalgias.  Skin: Negative for rash.  Allergic/Immunologic: Negative for environmental allergies and food allergies.  Neurological: Negative for dizziness, weakness, light-headedness, numbness and headaches.  Hematological: Negative for adenopathy. Does not bruise/bleed easily.  Psychiatric/Behavioral: Negative for dysphoric mood. The patient is not nervous/anxious.      Patient Active Problem List   Diagnosis Date Noted  . Twin pregnancy, twins dichorionic and diamniotic 02/22/2017  . SVD (spontaneous vaginal delivery): Mercie Eon Twins, vtx, double footling breech 02/22/2017  . Postpartum care following vaginal delivery (7/15) 02/22/2017  . Rh negative status during pregnancy 09/01/2014  . Acute blood loss anemia 09/01/2014  . Labor and delivery, indication for care 08/31/2014  . GENERALIZED ANXIETY DISORDER 06/19/2009  . PALPITATIONS 06/19/2009  . ABNORMAL ELECTROCARDIOGRAM 06/19/2009    No Known Allergies  Past Surgical History:  Procedure Laterality Date  . THYROIDECTOMY, PARTIAL      Social History   Tobacco Use  . Smoking status: Never Smoker  . Smokeless tobacco: Never Used  Substance Use Topics  . Alcohol use: No  . Drug use: No     Medication list has been reviewed and updated.  Current Meds  Medication Sig  . naproxen sodium (ANAPROX) 550 MG tablet Take 1 tablet (550 mg total) by mouth 2 (two) times daily.    PHQ 2/9 Scores 05/05/2018 10/02/2015  PHQ - 2 Score 0 0  PHQ- 9 Score 1 -    Physical Exam  Constitutional: She is oriented to person, place, and time. She appears well-developed and well-nourished.  HENT:  Head: Normocephalic.  Right Ear: External ear normal.  Left Ear: External ear normal.  Mouth/Throat: Oropharynx is clear and moist.  Eyes: Pupils are equal, round, and reactive to light. Conjunctivae and EOM are normal. Lids are everted and swept, no foreign bodies found. Left eye exhibits no hordeolum. No foreign body present in the left eye. Right conjunctiva is not injected. Left conjunctiva is not injected. No scleral icterus.  Neck: Normal range of motion. Neck supple. No JVD present. No tracheal deviation present. No thyromegaly present.  Cardiovascular: Normal rate, regular  rhythm, normal heart sounds and intact distal pulses. Exam reveals no gallop and no friction rub.  No murmur heard. Pulmonary/Chest:  Effort normal and breath sounds normal. No stridor. No respiratory distress. She has no wheezes. She has no rales. She exhibits no tenderness.  Abdominal: Soft. Bowel sounds are normal. She exhibits no mass. There is no hepatosplenomegaly. There is no tenderness. There is no rebound and no guarding.  Musculoskeletal: Normal range of motion. She exhibits no edema or tenderness.  Lymphadenopathy:    She has no cervical adenopathy.  Neurological: She is alert and oriented to person, place, and time. She has normal strength. She displays normal reflexes. No cranial nerve deficit.  Skin: Skin is warm. No rash noted.  Psychiatric: She has a normal mood and affect. Her mood appears not anxious. She does not exhibit a depressed mood.  Nursing note and vitals reviewed.   BP 120/64   Pulse 72   Ht 5\' 9"  (1.753 m)   Wt 168 lb (76.2 kg)   BMI 24.81 kg/m   Assessment and Plan:  1. Encounter for physical examination related to employment Subjective/objective concerns addressed. Labs obtained - Hemoglobin A1c - Renal Function Panel - Lipid panel  2. Chest pain in adult Posterior of 1 year duration and positional. Will check for pleural/arthropathy circumstances. - DG Chest 2 View; Future - DG Thoracic Spine W/Swimmers; Future  3. History of subtotal thyroidectomy Partial thyroidectomy with surveillance - TSH  4. Flu vaccine need Discused and administered. - Flu Vaccine QUAD 6+ mos PF IM (Fluarix Quad PF)    Dr. Elizabeth Sauer [facility]  05/05/2018

## 2018-05-06 LAB — RENAL FUNCTION PANEL
Albumin: 5 g/dL (ref 3.5–5.5)
BUN / CREAT RATIO: 18 (ref 9–23)
BUN: 13 mg/dL (ref 6–20)
CO2: 21 mmol/L (ref 20–29)
CREATININE: 0.73 mg/dL (ref 0.57–1.00)
Calcium: 9.3 mg/dL (ref 8.7–10.2)
Chloride: 103 mmol/L (ref 96–106)
GFR calc non Af Amer: 109 mL/min/{1.73_m2} (ref >59)
GFR, EST AFRICAN AMERICAN: 125 mL/min/{1.73_m2} (ref >59)
Glucose: 88 mg/dL (ref 65–99)
Phosphorus: 3.1 mg/dL (ref 2.5–4.5)
Potassium: 4.1 mmol/L (ref 3.5–5.2)
Sodium: 140 mmol/L (ref 134–144)

## 2018-05-06 LAB — LIPID PANEL
Chol/HDL Ratio: 3.1 ratio (ref 0.0–4.4)
Cholesterol, Total: 179 mg/dL (ref 100–199)
HDL: 58 mg/dL (ref >39)
LDL CALC: 109 mg/dL — AB (ref 0–99)
Triglycerides: 60 mg/dL (ref 0–149)
VLDL CHOLESTEROL CAL: 12 mg/dL (ref 5–40)

## 2018-05-06 LAB — HEMOGLOBIN A1C
Est. average glucose Bld gHb Est-mCnc: 105 mg/dL
HEMOGLOBIN A1C: 5.3 % (ref 4.8–5.6)

## 2018-05-06 LAB — TSH: TSH: 1 u[IU]/mL (ref 0.450–4.500)

## 2018-08-11 NOTE — L&D Delivery Note (Addendum)
Delivery Note At 5:05 PM a viable and healthy female was delivered via  (Presentation: OP ).  APGAR: 9, 9; weight pending .   Placenta status: spontaneous, intact.  Cord:  with the following complications: none.  Cord pH: na  Anesthesia:  epidural Episiotomy:  na Lacerations:  na Suture Repair: na Est. Blood Loss (mL):  100  Mom to postpartum.  Baby to Couplet care / Skin to Skin.  Jenese Mischke J 07/13/2019, 5:15 PM

## 2018-10-23 IMAGING — US US THYROID BIOPSY
1 series · 13 of 21 positions shown · non-contrast
Comparison: US Thyroid 05/14/2016 and US Soft Tissue Head and Neck
06/15/2009

MEDICATIONS:
5 cc 1% lidocaine

COMPLICATIONS:
Small hematoma post procedure

INDICATION: Left thyroid nodule; 2.6 cm

Interval enlargement from 1.9 cm 5717
Pt underwent Rt thyroidectomy [DATE]:  Follicular adenoma
EXAM:
ULTRASOUND GUIDED FINE NEEDLE ASPIRATION OF INDETERMINATE THYROID
NODULE
TECHNIQUE: Informed written consent was obtained from the patient after a
discussion of the risks, benefits and alternatives to treatment.
Questions regarding the procedure were encouraged and answered. A
timeout was performed prior to the initiation of the procedure.

[Series 1: us thyroid biopsy · 0.05mm/px · 21 acquisitions, 13 frames shown]
[im 1/21]
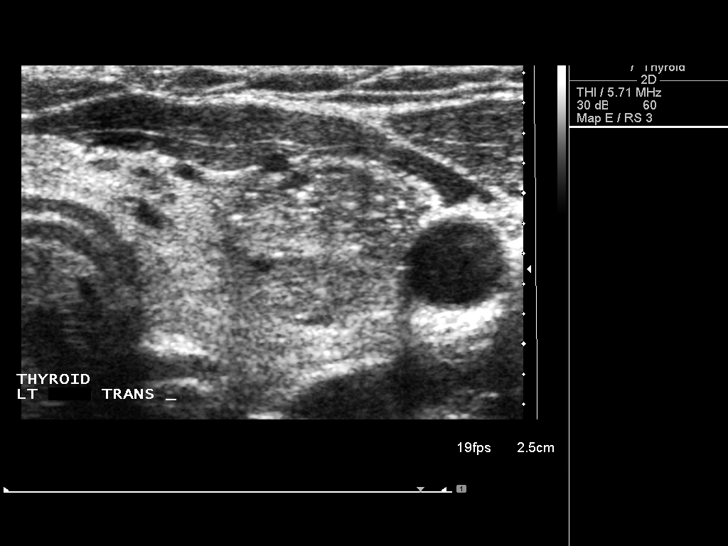
[im 3/21]
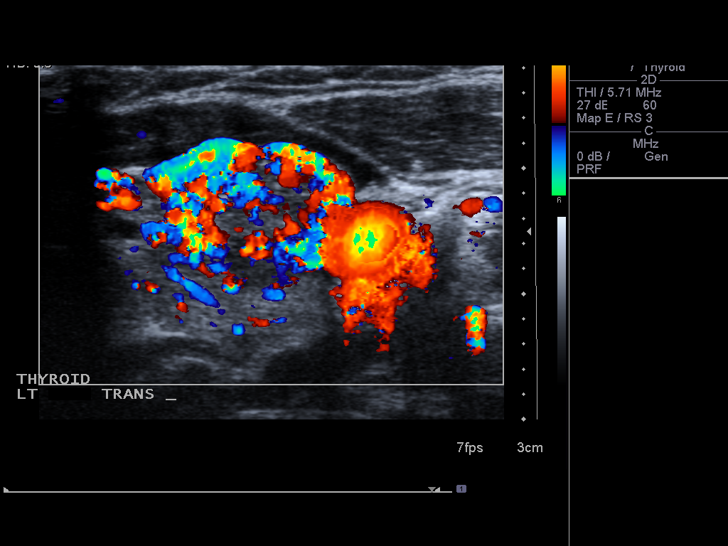
[im 5/21]
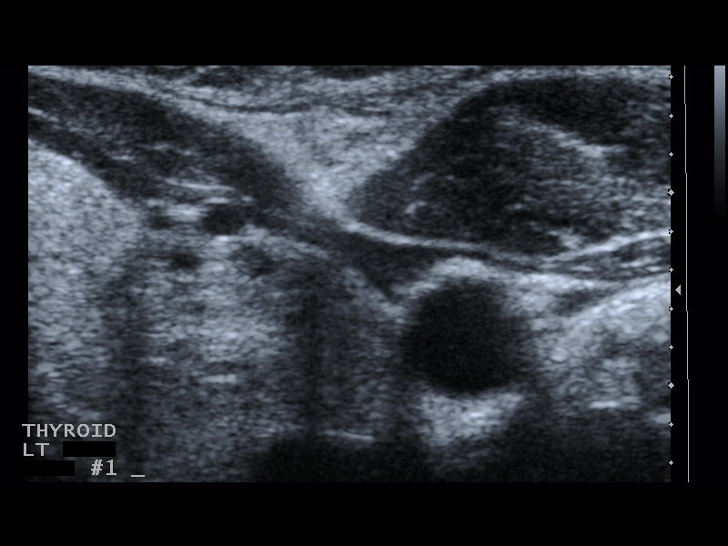
[im 6/21]
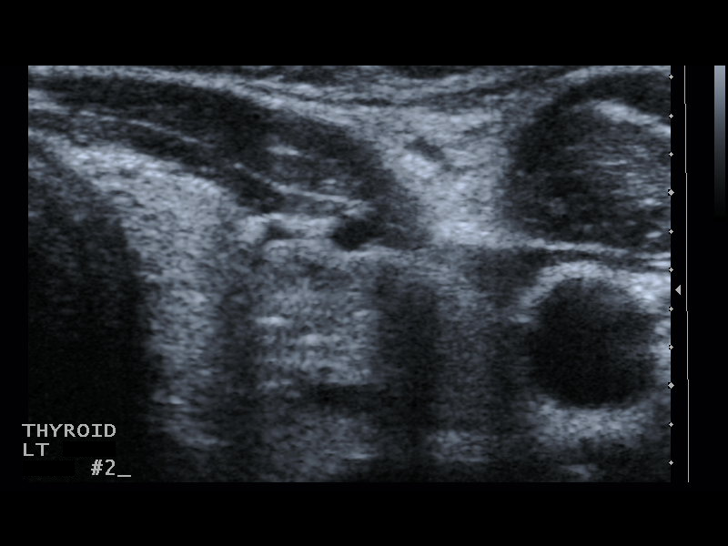
[im 8/21]
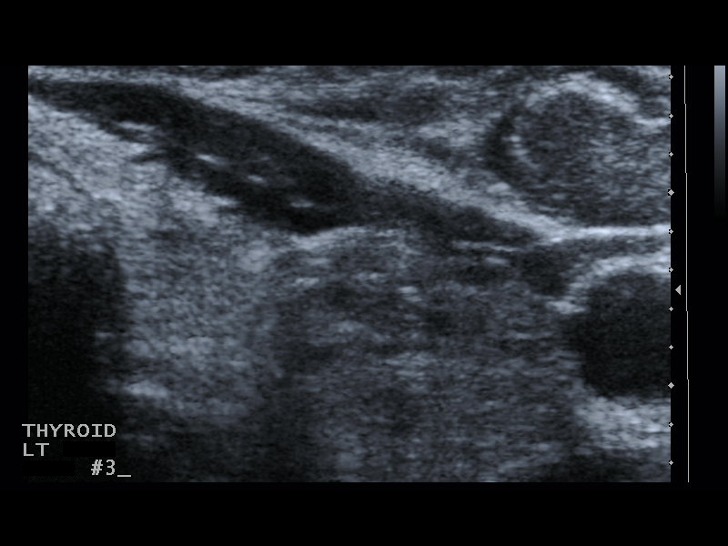
[im 9/21]
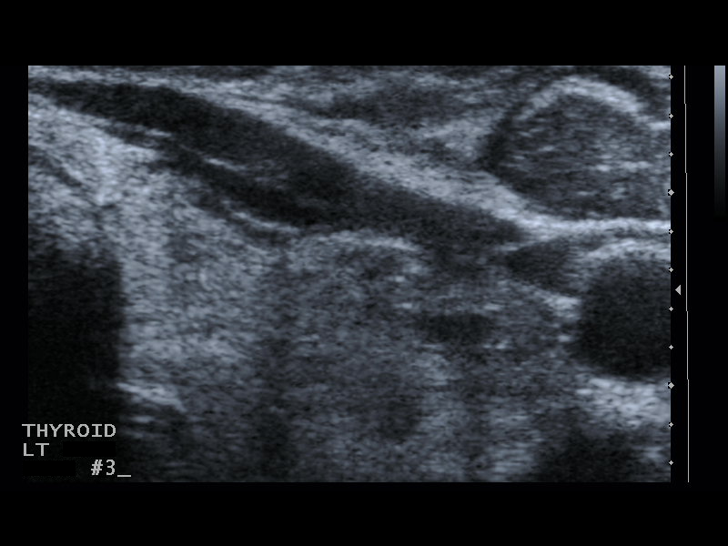
[im 11/21]
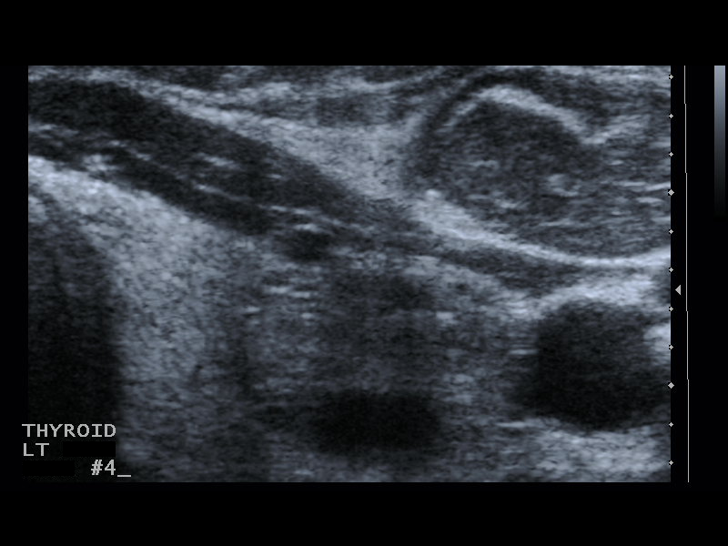
[im 13/21]
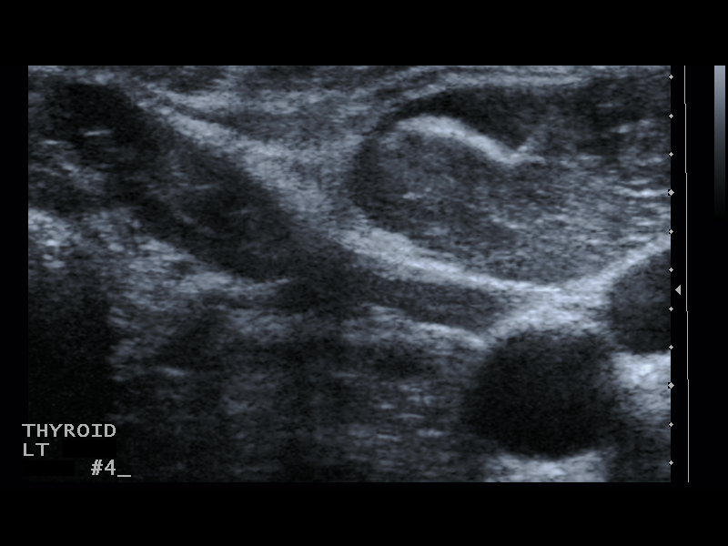
[im 14/21]
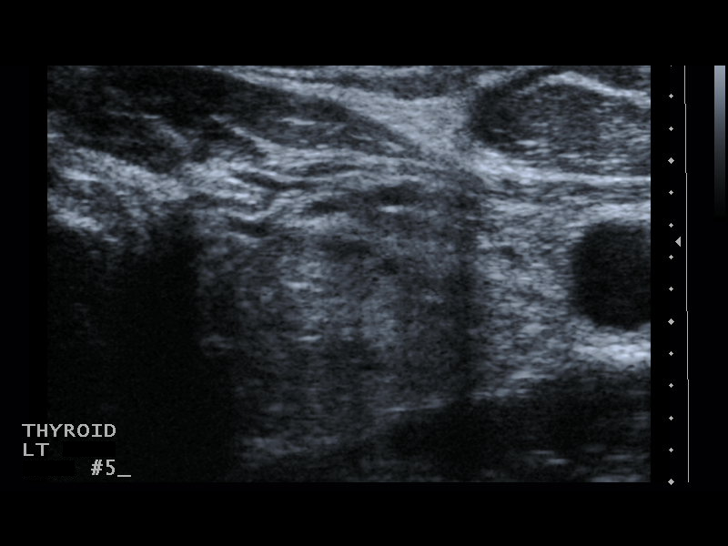
[im 16/21]
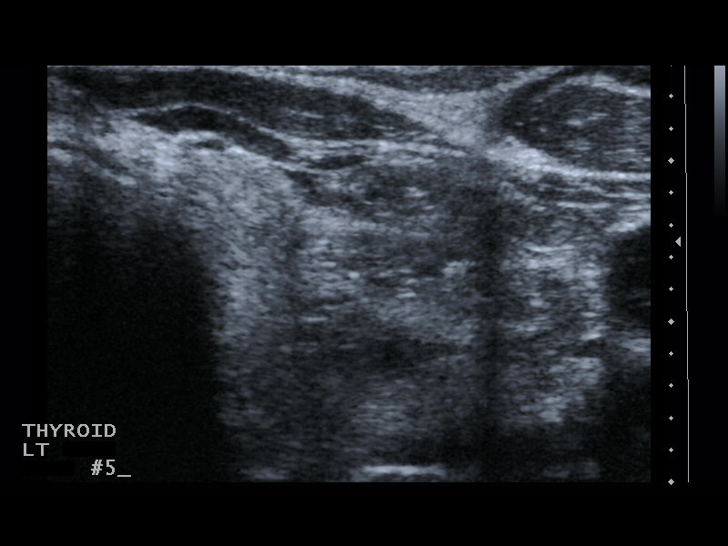
[im 17/21]
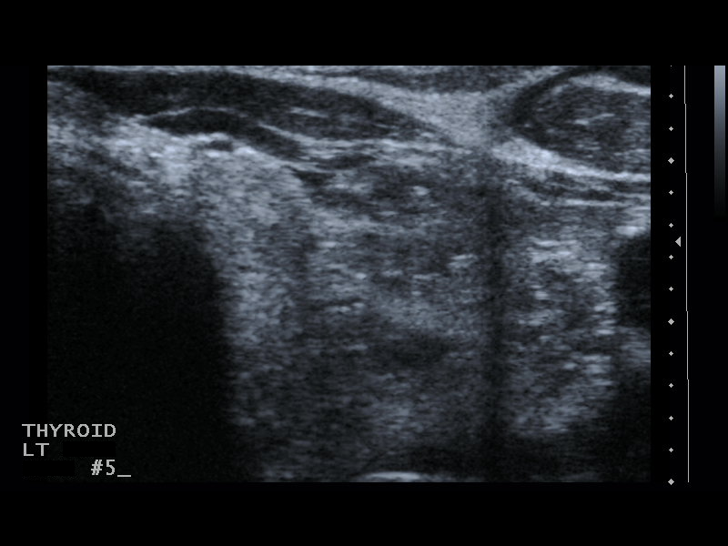
[im 19/21]
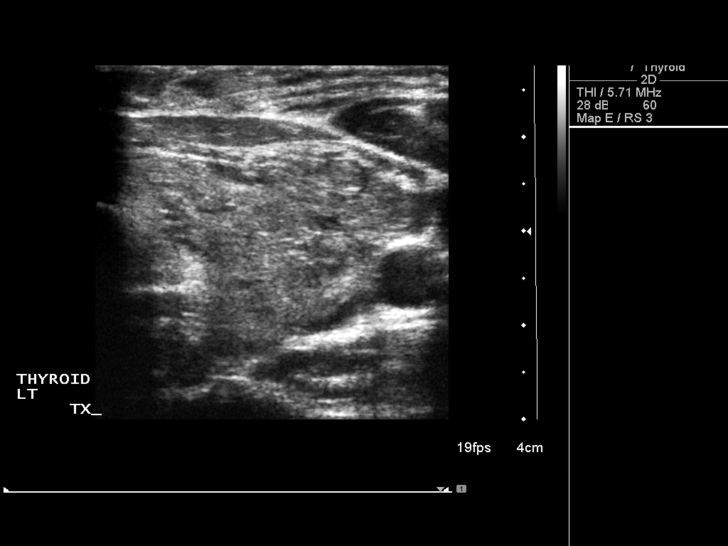
[im 21/21]
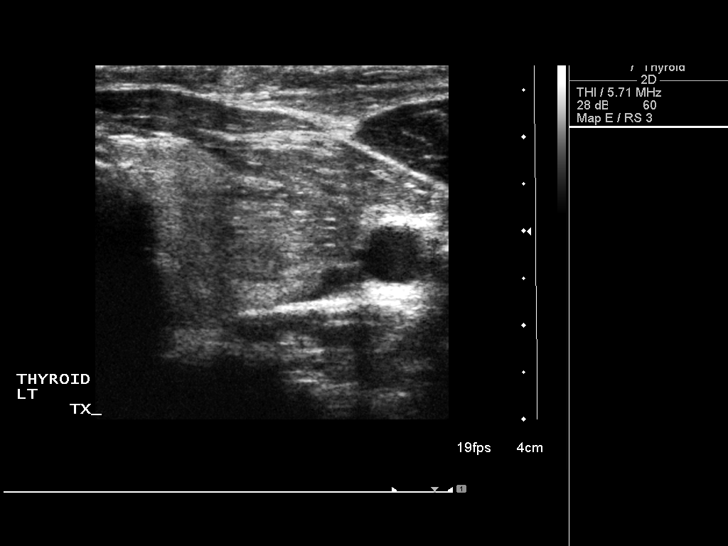

[13 of 21 positions shown; findings below may reference images not displayed]

Pre-procedural ultrasound scanning demonstrated unchanged size and
appearance of the indeterminate nodule within the left thyroid

The procedure was planned. The neck was prepped in the usual sterile
fashion, and a sterile drape was applied covering the operative
field. A timeout was performed prior to the initiation of the
procedure. Local anesthesia was provided with 1% lidocaine.

Under direct ultrasound guidance, 5 FNA biopsies were performed of
the left thyroid nodule with a 25 gauge needle.

(2 samples were sent for MIREYA per EBADAT)

Multiple ultrasound images were saved for procedural documentation
purposes. The samples were prepared and submitted to pathology.

Limited post procedural scanning was negative for hematoma or
additional complication. Dressings were placed. The patient
tolerated the above procedures procedure well without immediate
postprocedural complication.
FINDINGS: FINDINGS
Nodule reference number based on prior diagnostic ultrasound: 1

Maximum size:  2.6 cm

Location: Left  ;  Mid

ACR TI-RADS total points: 3

ACR TI-RADS risk category:  TR3 (3 points)

Prior biopsy:  No

Reason for biopsy: meets ACR TI-RADS criteria

Ultrasound imaging confirms appropriate placement of the needles
within the thyroid nodule.
IMPRESSION: Technically successful ultrasound guided fine needle aspiration of
left thyroid nodule

Read by

Flimkien Laudal

## 2018-12-15 LAB — OB RESULTS CONSOLE ABO/RH: RH Type: NEGATIVE

## 2018-12-15 LAB — OB RESULTS CONSOLE GC/CHLAMYDIA
Chlamydia: NEGATIVE
Gonorrhea: NEGATIVE

## 2018-12-15 LAB — OB RESULTS CONSOLE HIV ANTIBODY (ROUTINE TESTING): HIV: NONREACTIVE

## 2018-12-15 LAB — OB RESULTS CONSOLE RUBELLA ANTIBODY, IGM: Rubella: NON-IMMUNE/NOT IMMUNE

## 2018-12-15 LAB — OB RESULTS CONSOLE ANTIBODY SCREEN: Antibody Screen: NEGATIVE

## 2018-12-15 LAB — OB RESULTS CONSOLE RPR: RPR: NONREACTIVE

## 2018-12-15 LAB — OB RESULTS CONSOLE HEPATITIS B SURFACE ANTIGEN: Hepatitis B Surface Ag: NEGATIVE

## 2019-06-22 LAB — OB RESULTS CONSOLE GBS: GBS: NEGATIVE

## 2019-06-25 ENCOUNTER — Other Ambulatory Visit: Payer: Self-pay

## 2019-06-25 ENCOUNTER — Inpatient Hospital Stay (HOSPITAL_COMMUNITY)
Admission: AD | Admit: 2019-06-25 | Discharge: 2019-06-26 | Disposition: A | Discharge status: Home / Self Care | Payer: No Typology Code available for payment source | Attending: Obstetrics and Gynecology | Admitting: Obstetrics and Gynecology

## 2019-06-25 ENCOUNTER — Encounter (HOSPITAL_COMMUNITY): Payer: Self-pay

## 2019-06-25 DIAGNOSIS — K219 Gastro-esophageal reflux disease without esophagitis: Secondary | ICD-10-CM | POA: Insufficient documentation

## 2019-06-25 DIAGNOSIS — N9089 Other specified noninflammatory disorders of vulva and perineum: Secondary | ICD-10-CM | POA: Insufficient documentation

## 2019-06-25 DIAGNOSIS — Z79899 Other long term (current) drug therapy: Secondary | ICD-10-CM | POA: Insufficient documentation

## 2019-06-25 DIAGNOSIS — K5900 Constipation, unspecified: Secondary | ICD-10-CM | POA: Insufficient documentation

## 2019-06-25 DIAGNOSIS — Z3A36 36 weeks gestation of pregnancy: Secondary | ICD-10-CM | POA: Insufficient documentation

## 2019-06-25 DIAGNOSIS — O3473 Maternal care for abnormality of vulva and perineum, third trimester: Secondary | ICD-10-CM | POA: Insufficient documentation

## 2019-06-25 DIAGNOSIS — O99613 Diseases of the digestive system complicating pregnancy, third trimester: Secondary | ICD-10-CM | POA: Insufficient documentation

## 2019-06-25 DIAGNOSIS — I863 Vulval varices: Secondary | ICD-10-CM

## 2019-06-25 NOTE — MAU Note (Addendum)
Presents with "something is hanging out of my vagina starting around approx an hour ago. Pos FM. No vag bldg or LOF.    Gilmer Mor RN

## 2019-06-26 DIAGNOSIS — I863 Vulval varices: Secondary | ICD-10-CM

## 2019-06-26 DIAGNOSIS — Z3A36 36 weeks gestation of pregnancy: Secondary | ICD-10-CM | POA: Diagnosis not present

## 2019-06-26 DIAGNOSIS — Z79899 Other long term (current) drug therapy: Secondary | ICD-10-CM | POA: Diagnosis not present

## 2019-06-26 DIAGNOSIS — K5900 Constipation, unspecified: Secondary | ICD-10-CM | POA: Diagnosis not present

## 2019-06-26 DIAGNOSIS — O3473 Maternal care for abnormality of vulva and perineum, third trimester: Secondary | ICD-10-CM | POA: Diagnosis not present

## 2019-06-26 DIAGNOSIS — O26893 Other specified pregnancy related conditions, third trimester: Secondary | ICD-10-CM

## 2019-06-26 DIAGNOSIS — N9089 Other specified noninflammatory disorders of vulva and perineum: Secondary | ICD-10-CM | POA: Diagnosis not present

## 2019-06-26 DIAGNOSIS — O99613 Diseases of the digestive system complicating pregnancy, third trimester: Secondary | ICD-10-CM | POA: Diagnosis not present

## 2019-06-26 DIAGNOSIS — K219 Gastro-esophageal reflux disease without esophagitis: Secondary | ICD-10-CM | POA: Diagnosis not present

## 2019-06-26 NOTE — MAU Provider Note (Signed)
History     CSN: [id number]  Arrival date and time: 06/25/19 2258   First Provider Initiated Contact with Patient 06/26/19 0017      No chief complaint on file.  HPI   [name] is a 33 y.o. female [phone] @ [redacted]w[redacted]d here in MAU with concerns about something hanging out of her vagina. She was sitting on the toilet bearing down pretty hard having a BM when she felt pain and pressure in her vagina. Symptoms started 1 hour ago. Says she felt around her vagina and it felt like something was hanging out of the vagina. She denies vaginal bleeding. + fetal movement.   OB History    Gravida  4   Para  3   Term  2   Preterm  1   AB      Living  4     SAB      TAB      Ectopic      Multiple  1   Live Births  4           Past Medical History:  Diagnosis Date  . 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 Dysrhythmia    hx of tachycardia  . GERD (gastroesophageal reflux disease)   . PP care, s/p SVD 12/24 08/04/2011    Past Surgical History:  Procedure Laterality Date  . THYROIDECTOMY, PARTIAL      Family History  Problem Relation Age of Onset  . Cancer Mother        melanoma  . Kidney disease Maternal Grandfather        chronic renal failure  . Diabetes Maternal Grandfather   . Heart disease Maternal Grandfather   . Hypertension Paternal Grandmother   . Peripheral vascular disease Paternal Grandmother   . COPD Paternal Grandfather   . Diabetes Paternal Grandfather   . Hypertension Paternal Grandfather   . Heart disease Paternal Grandfather     Social History   Tobacco Use  . Smoking status: Never Smoker  . Smokeless tobacco: Never Used  Substance Use Topics  . Alcohol use: No  . Drug use: No    Allergies: No Known Allergies  Medications Prior to Admission  Medication Sig Dispense Refill Last Dose  . famotidine (PEPCID) 20 MG tablet Take 20 mg by mouth daily. Takes two daily at same time     . naproxen sodium (ANAPROX) 550  MG tablet Take 1 tablet (550 mg total) by mouth 2 (two) times daily. 20 tablet 0   . pyridOXINE (VITAMIN B-6) 100 MG tablet Take 100 mg by mouth daily.     . sertraline (ZOLOFT) 25 MG tablet Take 25 mg by mouth daily.      No results found for this or any previous visit (from the past 48 hour(s)).  Review of Systems  Constitutional: Negative for fever.  Gastrointestinal: Negative for abdominal pain.  Genitourinary: Positive for vaginal pain.   Physical Exam   Blood pressure 114/77, pulse 97, temperature 98.7 F (37.1 C), temperature source Oral, resp. rate 18, SpO2 100 %, unknown if currently breastfeeding.  Physical Exam  Constitutional: She is oriented to person, place, and time. She appears well-developed and well-nourished. No distress.  GI: Soft. She exhibits no distension. There is no abdominal tenderness. There is no rebound.  Genitourinary:    Genitourinary Comments: Vagina - Small amount of white vaginal discharge, no odor, multiple varicosities noted at introitus. + Edema of both labia's Bimanual exam:  Cervix unable to examine d/t amount of stool  Chaperone present for exam.    Musculoskeletal: Normal range of motion.  Neurological: She is alert and oriented to person, place, and time.  Skin: Skin is warm. She is not diaphoretic.  Psychiatric: Her behavior is normal.   Fetal Tracing: Baseline: 135 bpm Variability: Moderate  Accelerations: 15x15 Decelerations: None Toco: One contraction  MAU Course  Procedures  None  MDM   Assessment and Plan   A:  1. Edema of vulva   2. Varicosities of vulva   3. [redacted] weeks gestation of pregnancy   4. Constipation, unspecified constipation type     P:  Discharge home in stable condition Return to MAU if symptoms worsen Miralax clean out discussed Ice perineum often Colace daily  , Artist Pais, NP 06/26/2019 5:55 AM

## 2019-06-26 NOTE — Discharge Instructions (Signed)
You have constipation which is hard stools that are difficult to pass. It is important to have regular bowel movements every 1-3 days that are soft and easy to pass. Hard stools increase your risk of hemorrhoids and are very uncomfortable.   To prevent constipation you can increase the amount of fiber in your diet. Examples of foods with fiber are leafy greens, whole grain breads, oatmeal and other grains.  It is also important to drink at least eight 8oz glass of water everyday.   If you have not has a bowel movement in 4-5 days you made need to clean out your bowel.  This will have establish normal movement through your bowel.    Miralax Clean out  Take 8 capfuls of miralax in 64 oz of gatorade. You can use any fluid that appeals to you (gatorade, water, juice)  Continue to drink at least eight 8 oz glasses of water throughout the day  You can repeat with another 8 capfuls of miralax in 64 oz of gatorade if you are not having a large amount of stools  You will need to be at home and close to a bathroom for about 8 hours when you do the above as you may need to go to the bathroom frequently.   After you are cleaned out: - Start Colace100mg  twice daily - Start Miralax once daily - Start a daily fiber supplement like metamucil or citrucel - You can safely use enemas in pregnancy  - if you are having diarrhea you can reduce to Colace once a day or miralax every other day or a 1/2 capful daily.     How to Take a [employer] A sitz bath is a warm water bath that may be used to care for your rectum, genital area, or the area between your rectum and genitals (perineum). For a sitz bath, the water only comes up to your hips and covers your buttocks. A sitz bath may done at home in a bathtub or with a portable sitz bath that fits over the toilet. Your health care provider may recommend a sitz bath to help:  Relieve pain and discomfort after delivering a baby.  Relieve pain and itching from  hemorrhoids or anal fissures.  Relieve pain after certain surgeries.  Relax muscles that are sore or tight. How to take a sitz bath Take 3-4 sitz baths a day, or as many as told by your health care provider. Bathtub sitz bath To take a sitz bath in a bathtub: 1. Partially fill a bathtub with warm water. The water should be deep enough to cover your hips and buttocks when you are sitting in the tub. 2. If your health care provider told you to put medicine in the water, follow his or her instructions. 3. Sit in the water. 4. Open the tub drain a little, and leave it open during your bath. 5. Turn on the warm water again, enough to replace the water that is draining out. Keep the water running throughout your bath. This helps keep the water at the right level and the right temperature. 6. Soak in the water for 15-20 minutes, or as long as told by your health care provider. 7. When you are done, be careful when you stand up. You may feel dizzy. 8. After the sitz bath, pat yourself dry. Do not rub your skin to dry it.  Over-the-toilet sitz bath To take a sitz bath with an over-the-toilet basin: 1. Follow the manufacturer's instructions. 2.  Fill the basin with warm water. 3. If your health care provider told you to put medicine in the water, follow his or her instructions. 4. Sit on the seat. Make sure the water covers your buttocks and perineum. 5. Soak in the water for 15-20 minutes, or as long as told by your health care provider. 6. After the sitz bath, pat yourself dry. Do not rub your skin to dry it. 7. Clean and dry the basin between uses. 8. Discard the basin if it cracks, or according to the manufacturer's instructions. Contact a health care provider if:  Your symptoms get worse. Do not continue with sitz baths if your symptoms get worse.  You have new symptoms. If this happens, do not continue with sitz baths until you talk with your health care provider. Summary  A sitz bath  is a warm water bath in which the water only comes up to your hips and covers your buttocks.  A sitz bath may help relieve itching, relieve pain, and relax muscles that are sore or tight in the lower part of your body, including your genital area.  Take 3-4 sitz baths a day, or as many as told by your health care provider. Soak in the water for 15-20 minutes.  Do not continue with sitz baths if your symptoms get worse. This information is not intended to replace advice given to you by your health care provider. Make sure you discuss any questions you have with your health care provider. Document Released: 04/19/2004 Document Revised: 07/30/2017 Document Reviewed: 07/30/2017 Elsevier Patient Education  2020 [employer].

## 2019-07-01 ENCOUNTER — Telehealth (HOSPITAL_COMMUNITY): Payer: Self-pay | Admitting: *Deleted

## 2019-07-01 ENCOUNTER — Encounter (HOSPITAL_COMMUNITY): Payer: Self-pay | Admitting: *Deleted

## 2019-07-01 NOTE — Telephone Encounter (Signed)
Preadmission screen  

## 2019-07-02 ENCOUNTER — Other Ambulatory Visit (HOSPITAL_COMMUNITY)
Admission: RE | Admit: 2019-07-02 | Discharge: 2019-07-02 | Disposition: A | Discharge status: Home / Self Care | Payer: No Typology Code available for payment source | Source: Ambulatory Visit | Attending: Obstetrics and Gynecology | Admitting: Obstetrics and Gynecology

## 2019-07-02 DIAGNOSIS — Z01812 Encounter for preprocedural laboratory examination: Secondary | ICD-10-CM | POA: Diagnosis not present

## 2019-07-02 DIAGNOSIS — Z20828 Contact with and (suspected) exposure to other viral communicable diseases: Secondary | ICD-10-CM | POA: Insufficient documentation

## 2019-07-02 LAB — SARS CORONAVIRUS 2 (TAT 6-24 HRS): SARS Coronavirus 2: NEGATIVE

## 2019-07-05 ENCOUNTER — Ambulatory Visit (HOSPITAL_COMMUNITY): Admission: RE | Admit: 2019-07-05 | Payer: No Typology Code available for payment source | Source: Ambulatory Visit

## 2019-07-05 ENCOUNTER — Telehealth (HOSPITAL_COMMUNITY): Payer: Self-pay | Admitting: *Deleted

## 2019-07-05 NOTE — Telephone Encounter (Signed)
Preadmission screen  

## 2019-07-06 ENCOUNTER — Other Ambulatory Visit: Payer: Self-pay | Admitting: Obstetrics and Gynecology

## 2019-07-11 ENCOUNTER — Other Ambulatory Visit (HOSPITAL_COMMUNITY)
Admission: RE | Admit: 2019-07-11 | Discharge: 2019-07-11 | Disposition: A | Discharge status: Home / Self Care | Payer: No Typology Code available for payment source | Source: Ambulatory Visit | Attending: Obstetrics and Gynecology | Admitting: Obstetrics and Gynecology

## 2019-07-11 LAB — SARS CORONAVIRUS 2 (TAT 6-24 HRS): SARS Coronavirus 2: NEGATIVE

## 2019-07-13 ENCOUNTER — Inpatient Hospital Stay (HOSPITAL_COMMUNITY): Payer: No Typology Code available for payment source | Admitting: Anesthesiology

## 2019-07-13 ENCOUNTER — Inpatient Hospital Stay (HOSPITAL_COMMUNITY)
Admission: AD | Admit: 2019-07-13 | Discharge: 2019-07-15 | DRG: 807 | Disposition: A | Discharge status: Home / Self Care | Payer: No Typology Code available for payment source | Attending: Obstetrics and Gynecology | Admitting: Obstetrics and Gynecology

## 2019-07-13 ENCOUNTER — Other Ambulatory Visit: Payer: Self-pay

## 2019-07-13 ENCOUNTER — Encounter (HOSPITAL_COMMUNITY): Payer: Self-pay

## 2019-07-13 ENCOUNTER — Inpatient Hospital Stay (HOSPITAL_COMMUNITY): Payer: No Typology Code available for payment source

## 2019-07-13 DIAGNOSIS — Z20828 Contact with and (suspected) exposure to other viral communicable diseases: Secondary | ICD-10-CM | POA: Diagnosis present

## 2019-07-13 DIAGNOSIS — Z3A39 39 weeks gestation of pregnancy: Secondary | ICD-10-CM

## 2019-07-13 DIAGNOSIS — O26893 Other specified pregnancy related conditions, third trimester: Principal | ICD-10-CM | POA: Diagnosis present

## 2019-07-13 DIAGNOSIS — O26899 Other specified pregnancy related conditions, unspecified trimester: Secondary | ICD-10-CM

## 2019-07-13 DIAGNOSIS — O9902 Anemia complicating childbirth: Secondary | ICD-10-CM | POA: Diagnosis present

## 2019-07-13 DIAGNOSIS — Z0000 Encounter for general adult medical examination without abnormal findings: Secondary | ICD-10-CM | POA: Diagnosis present

## 2019-07-13 DIAGNOSIS — D649 Anemia, unspecified: Secondary | ICD-10-CM | POA: Diagnosis present

## 2019-07-13 DIAGNOSIS — O320XX0 Maternal care for unstable lie, not applicable or unspecified: Secondary | ICD-10-CM | POA: Diagnosis present

## 2019-07-13 DIAGNOSIS — Z6791 Unspecified blood type, Rh negative: Secondary | ICD-10-CM | POA: Diagnosis not present

## 2019-07-13 DIAGNOSIS — Z3490 Encounter for supervision of normal pregnancy, unspecified, unspecified trimester: Secondary | ICD-10-CM | POA: Diagnosis present

## 2019-07-13 LAB — CBC
HCT: 34.2 % — ABNORMAL LOW (ref 36.0–46.0)
Hemoglobin: 11.3 g/dL — ABNORMAL LOW (ref 12.0–15.0)
MCH: 31 pg (ref 26.0–34.0)
MCHC: 33 g/dL (ref 30.0–36.0)
MCV: 93.7 fL (ref 80.0–100.0)
Platelets: 206 10*3/uL (ref 150–400)
RBC: 3.65 MIL/uL — ABNORMAL LOW (ref 3.87–5.11)
RDW: 13.3 % (ref 11.5–15.5)
WBC: 8.7 10*3/uL (ref 4.0–10.5)
nRBC: 0 % (ref 0.0–0.2)

## 2019-07-13 LAB — TYPE AND SCREEN
ABO/RH(D): O NEG
Antibody Screen: NEGATIVE

## 2019-07-13 LAB — RPR: RPR Ser Ql: NONREACTIVE

## 2019-07-13 LAB — ABO/RH: ABO/RH(D): O NEG

## 2019-07-13 MED ORDER — OXYCODONE-ACETAMINOPHEN 5-325 MG PO TABS: 2.0000 | ORAL_TABLET | ORAL | Status: DC | PRN

## 2019-07-13 MED ORDER — DIPHENHYDRAMINE HCL 50 MG/ML IJ SOLN: 12.5000 mg | INTRAMUSCULAR | Status: DC | PRN

## 2019-07-13 MED ORDER — LIDOCAINE HCL (PF) 1 % IJ SOLN: 30.0000 mL | INTRAMUSCULAR | Status: DC | PRN

## 2019-07-13 MED ORDER — LACTATED RINGERS IV SOLN: 500.0000 mL | INTRAVENOUS | Status: DC | PRN

## 2019-07-13 MED ORDER — OXYTOCIN 40 UNITS IN NORMAL SALINE INFUSION - SIMPLE MED: 2.5000 [IU]/h | INTRAVENOUS | Status: DC

## 2019-07-13 MED ORDER — METHYLERGONOVINE MALEATE 0.2 MG/ML IJ SOLN: 0.2000 mg | INTRAMUSCULAR | Status: DC | PRN

## 2019-07-13 MED ORDER — EPHEDRINE 5 MG/ML INJ: 10.0000 mg | INTRAVENOUS | Status: DC | PRN

## 2019-07-13 MED ORDER — SODIUM CHLORIDE (PF) 0.9 % IJ SOLN
INTRAMUSCULAR | Status: DC | PRN
  Administered 2019-07-13: 12 mL/h via EPIDURAL

## 2019-07-13 MED ORDER — WITCH HAZEL-GLYCERIN EX PADS: 1.0000 "application " | MEDICATED_PAD | CUTANEOUS | Status: DC | PRN

## 2019-07-13 MED ORDER — SOD CITRATE-CITRIC ACID 500-334 MG/5ML PO SOLN
30.0000 mL | ORAL | Status: DC | PRN
  Administered 2019-07-13: 30 mL via ORAL
  Filled 2019-07-13: qty 30

## 2019-07-13 MED ORDER — ZOLPIDEM TARTRATE 5 MG PO TABS: 5.0000 mg | ORAL_TABLET | Freq: Every evening | ORAL | Status: DC | PRN

## 2019-07-13 MED ORDER — ONDANSETRON HCL 4 MG/2ML IJ SOLN: 4.0000 mg | INTRAMUSCULAR | Status: DC | PRN

## 2019-07-13 MED ORDER — BENZOCAINE-MENTHOL 20-0.5 % EX AERO: 1.0000 "application " | INHALATION_SPRAY | CUTANEOUS | Status: DC | PRN

## 2019-07-13 MED ORDER — TETANUS-DIPHTH-ACELL PERTUSSIS 5-2.5-18.5 LF-MCG/0.5 IM SUSP: 0.5000 mL | Freq: Once | INTRAMUSCULAR | Status: DC

## 2019-07-13 MED ORDER — METHYLERGONOVINE MALEATE 0.2 MG PO TABS: 0.2000 mg | ORAL_TABLET | ORAL | Status: DC | PRN

## 2019-07-13 MED ORDER — DIPHENHYDRAMINE HCL 25 MG PO CAPS: 25.0000 mg | ORAL_CAPSULE | Freq: Four times a day (QID) | ORAL | Status: DC | PRN

## 2019-07-13 MED ORDER — OXYTOCIN 40 UNITS IN NORMAL SALINE INFUSION - SIMPLE MED
1.0000 m[IU]/min | INTRAVENOUS | Status: DC
  Administered 2019-07-13: 2 m[IU]/min via INTRAVENOUS
  Filled 2019-07-13: qty 1000

## 2019-07-13 MED ORDER — LACTATED RINGERS IV SOLN
INTRAVENOUS | Status: DC
  Administered 2019-07-13 (×2): via INTRAVENOUS

## 2019-07-13 MED ORDER — TERBUTALINE SULFATE 1 MG/ML IJ SOLN: 0.2500 mg | Freq: Once | INTRAMUSCULAR | Status: DC | PRN

## 2019-07-13 MED ORDER — FENTANYL-BUPIVACAINE-NACL 0.5-0.125-0.9 MG/250ML-% EP SOLN
12.0000 mL/h | EPIDURAL | Status: DC | PRN
  Filled 2019-07-13: qty 250

## 2019-07-13 MED ORDER — SERTRALINE HCL 25 MG PO TABS
25.0000 mg | ORAL_TABLET | Freq: Every day | ORAL | Status: DC
  Filled 2019-07-13: qty 1

## 2019-07-13 MED ORDER — SENNOSIDES-DOCUSATE SODIUM 8.6-50 MG PO TABS
2.0000 | ORAL_TABLET | ORAL | Status: DC
  Administered 2019-07-14 – 2019-07-15 (×2): 2 via ORAL
  Filled 2019-07-13 (×2): qty 2

## 2019-07-13 MED ORDER — ONDANSETRON HCL 4 MG/2ML IJ SOLN
4.0000 mg | Freq: Four times a day (QID) | INTRAMUSCULAR | Status: DC | PRN
  Administered 2019-07-13: 4 mg via INTRAVENOUS
  Filled 2019-07-13: qty 2

## 2019-07-13 MED ORDER — OXYTOCIN BOLUS FROM INFUSION
500.0000 mL | Freq: Once | INTRAVENOUS | Status: AC
  Administered 2019-07-13: 500 mL via INTRAVENOUS

## 2019-07-13 MED ORDER — COCONUT OIL OIL: 1.0000 "application " | TOPICAL_OIL | Status: DC | PRN

## 2019-07-13 MED ORDER — LACTATED RINGERS IV SOLN
500.0000 mL | Freq: Once | INTRAVENOUS | Status: AC
  Administered 2019-07-13: 500 mL via INTRAVENOUS

## 2019-07-13 MED ORDER — PHENYLEPHRINE 40 MCG/ML (10ML) SYRINGE FOR IV PUSH (FOR BLOOD PRESSURE SUPPORT)
80.0000 ug | PREFILLED_SYRINGE | INTRAVENOUS | Status: DC | PRN
  Filled 2019-07-13: qty 10

## 2019-07-13 MED ORDER — ACETAMINOPHEN 325 MG PO TABS: 650.0000 mg | ORAL_TABLET | ORAL | Status: DC | PRN

## 2019-07-13 MED ORDER — SIMETHICONE 80 MG PO CHEW: 80.0000 mg | CHEWABLE_TABLET | ORAL | Status: DC | PRN

## 2019-07-13 MED ORDER — TRANEXAMIC ACID-NACL 1000-0.7 MG/100ML-% IV SOLN
INTRAVENOUS | Status: AC
  Administered 2019-07-13: 1000 mg via INTRAVENOUS
  Filled 2019-07-13: qty 100

## 2019-07-13 MED ORDER — OXYCODONE-ACETAMINOPHEN 5-325 MG PO TABS: 1.0000 | ORAL_TABLET | ORAL | Status: DC | PRN

## 2019-07-13 MED ORDER — ONDANSETRON HCL 4 MG PO TABS: 4.0000 mg | ORAL_TABLET | ORAL | Status: DC | PRN

## 2019-07-13 MED ORDER — PRENATAL MULTIVITAMIN CH
1.0000 | ORAL_TABLET | Freq: Every day | ORAL | Status: DC
  Administered 2019-07-14: 1 via ORAL
  Filled 2019-07-13: qty 1

## 2019-07-13 MED ORDER — TRANEXAMIC ACID-NACL 1000-0.7 MG/100ML-% IV SOLN
1000.0000 mg | INTRAVENOUS | Status: AC
  Administered 2019-07-13: 17:00:00 1000 mg via INTRAVENOUS

## 2019-07-13 MED ORDER — ACETAMINOPHEN 325 MG PO TABS
650.0000 mg | ORAL_TABLET | ORAL | Status: DC | PRN
  Administered 2019-07-13: 650 mg via ORAL
  Filled 2019-07-13: qty 2

## 2019-07-13 MED ORDER — IBUPROFEN 600 MG PO TABS
600.0000 mg | ORAL_TABLET | Freq: Four times a day (QID) | ORAL | Status: DC
  Administered 2019-07-14 – 2019-07-15 (×6): 600 mg via ORAL
  Filled 2019-07-13 (×6): qty 1

## 2019-07-13 MED ORDER — PHENYLEPHRINE 40 MCG/ML (10ML) SYRINGE FOR IV PUSH (FOR BLOOD PRESSURE SUPPORT): 80.0000 ug | PREFILLED_SYRINGE | INTRAVENOUS | Status: DC | PRN

## 2019-07-13 MED ORDER — DIBUCAINE (PERIANAL) 1 % EX OINT: 1.0000 "application " | TOPICAL_OINTMENT | CUTANEOUS | Status: DC | PRN

## 2019-07-13 MED ORDER — LIDOCAINE HCL (PF) 1 % IJ SOLN
INTRAMUSCULAR | Status: DC | PRN
  Administered 2019-07-13: 5 mL via EPIDURAL

## 2019-07-13 NOTE — Anesthesia Preprocedure Evaluation (Signed)
Anesthesia Evaluation  Patient identified by MRN, date of birth, ID band Patient awake    Reviewed: Allergy & Precautions, NPO status , Patient's Chart, lab work & pertinent test results  Airway Mallampati: II  TM Distance: >3 FB Neck ROM: Full    Dental no notable dental hx. (+) Teeth Intact   Pulmonary neg pulmonary ROS,    Pulmonary exam normal breath sounds clear to auscultation       Cardiovascular negative cardio ROS Normal cardiovascular exam Rhythm:Regular Rate:Normal     Neuro/Psych negative neurological ROS     GI/Hepatic Neg liver ROS, GERD  ,  Endo/Other  negative endocrine ROS  Renal/GU      Musculoskeletal negative musculoskeletal ROS (+)   Abdominal   Peds  Hematology Hgb 11.3 Plt 206   Anesthesia Other Findings   Reproductive/Obstetrics (+) Pregnancy                             Anesthesia Physical Anesthesia Plan  ASA: II  Anesthesia Plan: Epidural   Post-op Pain Management:    Induction:   PONV Risk Score and Plan:   Airway Management Planned:   Additional Equipment:   Intra-op Plan:   Post-operative Plan:   Informed Consent: I have reviewed the patients History and Physical, chart, labs and discussed the procedure including the risks, benefits and alternatives for the proposed anesthesia with the patient or authorized representative who has indicated his/her understanding and acceptance.       Plan Discussed with:   Anesthesia Plan Comments: (39 Wk G4P2 for LEA)        Anesthesia Quick Evaluation

## 2019-07-13 NOTE — Lactation Note (Signed)
This note was copied from a baby's chart. Lactation Consultation Note  Patient Name: Melissa Stark Today's Date: 07/13/2019 Reason for consult: Initial assessment;Term  Lactation met with parents and baby for initial assessment.  Mom is a G4P5. Previous BF and pumping experience.  BF one of her kids for up to 15 months. Mom desires to only BF.  Mom expressed that she is having trouble latching baby and is concerned about frenulum.  Assistance was offered to help hand express and latch baby.  LC watched mom try to latch baby, then offered assistance with sandwiching breast. Baby opens mouth wide on breast but then does not suckle.  LC asked mom if she could test baby's suckle reflex and it was determined for that moment baby may not be ready to nurse as of now.    LC suggested to use the spoon to feed baby.  Mom hand expressed colostrum into a spoon and fed baby.  Mom was told not to worry about function of frenulum yet, but do give lactation a call when baby is alert and ready to feed. LC suggested for the time being she may just want to hand express and give baby BM in a spoon.  Mom asked if she could have parts for the breast pump.  Mom was given extra spoons and DEBP kit.  LC set the kit up and showed her how to initiate pumping for stimulation purposes.    Recommendation Plan: -Breast Feed baby on demand 8 to 12x a day -Hand express milk into a spoon to feed baby -Call lactation to assist with feeding.  Maternal Data Formula Feeding for Exclusion: No Has patient been taught Hand Expression?: Yes Does the patient have breastfeeding experience prior to this delivery?: Yes  Feeding Feeding Type: Breast Milk  LATCH Score Latch: Too sleepy or reluctant, no latch achieved, no sucking elicited.  Audible Swallowing: None  Type of Nipple: Everted at rest and after stimulation  Comfort (Breast/Nipple): Soft / non-tender  Hold (Positioning): Assistance needed to correctly position  infant at breast and maintain latch.  LATCH Score: 5  Interventions Interventions: Assisted with latch;Hand express;Expressed milk;DEBP  Lactation Tools Discussed/Used Tools: Nipple Shields(24) Pump Review: Setup, frequency, and cleaning(Mother's request ) Initiated by:: HL(Mother's request) Date initiated:: 07/13/19   Consult Status Consult Status: Follow-up Date: 07/14/19 Follow-up type: In-patient    Heather Lynn 07/13/2019, 10:40 PM    

## 2019-07-13 NOTE — Anesthesia Procedure Notes (Signed)

## 2019-07-13 NOTE — H&P (Signed)
Melissa Stark is a 33 y.o. female presenting for IOL. OB History    Gravida  4   Para  3   Term  2   Preterm  1   AB      Living  4     SAB      TAB      Ectopic      Multiple  1   Live Births  4          Past Medical History:  Diagnosis Date  . 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 Dysrhythmia    hx of tachycardia  . GERD (gastroesophageal reflux disease)   . Low TSH level   . PP care, s/p SVD 12/24 08/04/2011   Past Surgical History:  Procedure Laterality Date  . THYROIDECTOMY, PARTIAL     Family History: family history includes COPD in her paternal grandfather; Cancer in her mother; Diabetes in her maternal grandfather and paternal grandfather; Heart disease in her maternal grandfather and paternal grandfather; Hypertension in her paternal grandfather and paternal grandmother; Kidney disease in her maternal grandfather; Peripheral vascular disease in her paternal grandmother. Social History:  reports that she has never smoked. She has never used smokeless tobacco. She reports that she does not drink alcohol or use drugs.     Maternal Diabetes: No Genetic Screening: Normal Maternal Ultrasounds/Referrals: Normal Fetal Ultrasounds or other Referrals:  None Maternal Substance Abuse:  No Significant Maternal Medications:  None Significant Maternal Lab Results:  Group B Strep negative Other Comments:  None  Review of Systems  Constitutional: Negative.   All other systems reviewed and are negative.  Maternal Medical History:  Reason for admission: Contractions.   Contractions: Frequency: irregular.   Perceived severity is mild.    Fetal activity: Perceived fetal activity is normal.   Last perceived fetal movement was within the past hour.    Prenatal complications: no prenatal complications Prenatal Complications - Diabetes: none.      Height 5\' 9"  (1.753 m), weight 93.4 kg, unknown if currently breastfeeding. Maternal Exam:   Uterine Assessment: Contraction strength is mild.  Contraction frequency is irregular.   Abdomen: Patient reports no abdominal tenderness. Fetal presentation: vertex  Introitus: Normal vulva. Normal vagina.  Ferning test: not done.  Nitrazine test: not done. Amniotic fluid character: not assessed.  Pelvis: adequate for delivery.   Cervix: Cervix evaluated by digital exam.     Physical Exam  Nursing note and vitals reviewed. Constitutional: She is oriented to person, place, and time. She appears well-developed and well-nourished.  HENT:  Head: Normocephalic and atraumatic.  Neck: Normal range of motion. Neck supple.  Cardiovascular: Normal rate and regular rhythm.  Respiratory: Effort normal and breath sounds normal.  GI: Soft. Bowel sounds are normal.  Genitourinary:    Vulva, vagina and uterus normal.   Musculoskeletal: Normal range of motion.  Neurological: She is alert and oriented to person, place, and time. She has normal reflexes.  Skin: Skin is warm and dry.  Psychiatric: She has a normal mood and affect.    Prenatal labs: ABO, Rh: O/Negative/-- (05/06 0000) Antibody: Negative (05/06 0000) Rubella: Nonimmune (05/06 0000) RPR: Nonreactive (05/06 0000)  HBsAg: Negative (05/06 0000)  HIV: Non-reactive (05/06 0000)  GBS:     Assessment/Plan: 39wks IUP Unstable lie IOL   Harjot Dibello J 07/13/2019, 8:08 AM

## 2019-07-14 ENCOUNTER — Encounter (HOSPITAL_COMMUNITY): Payer: Self-pay | Admitting: *Deleted

## 2019-07-14 LAB — CBC
HCT: 29.6 % — ABNORMAL LOW (ref 36.0–46.0)
Hemoglobin: 9.9 g/dL — ABNORMAL LOW (ref 12.0–15.0)
MCH: 31.1 pg (ref 26.0–34.0)
MCHC: 33.4 g/dL (ref 30.0–36.0)
MCV: 93.1 fL (ref 80.0–100.0)
Platelets: 209 10*3/uL (ref 150–400)
RBC: 3.18 MIL/uL — ABNORMAL LOW (ref 3.87–5.11)
RDW: 13.2 % (ref 11.5–15.5)
WBC: 10 10*3/uL (ref 4.0–10.5)
nRBC: 0 % (ref 0.0–0.2)

## 2019-07-14 MED ORDER — RHO D IMMUNE GLOBULIN 1500 UNIT/2ML IJ SOSY
300.0000 ug | PREFILLED_SYRINGE | Freq: Once | INTRAMUSCULAR | Status: AC
  Administered 2019-07-14: 300 ug via INTRAVENOUS
  Filled 2019-07-14: qty 2

## 2019-07-14 NOTE — Anesthesia Postprocedure Evaluation (Signed)
Anesthesia Post Note  Patient: Melissa Stark  Procedure(s) Performed: AN AD HOC LABOR EPIDURAL     Patient location during evaluation: Mother Baby Anesthesia Type: Epidural Level of consciousness: awake and alert Pain management: pain level controlled Vital Signs Assessment: post-procedure vital signs reviewed and stable Respiratory status: spontaneous breathing, nonlabored ventilation and respiratory function stable Cardiovascular status: stable Postop Assessment: no headache, no backache and epidural receding Anesthetic complications: no    Last Vitals:  Vitals:   07/14/19 0006 07/14/19 0424  BP: 126/81 117/69  Pulse: 98 96  Resp: 18 18  Temp: 37 C 37.1 C  SpO2:      Last Pain:  Vitals:   07/14/19 0519  TempSrc:   PainSc: 0-No pain   Pain Goal:                   Riki Sheer

## 2019-07-14 NOTE — Progress Notes (Signed)
Post Partum Day 1, SVD, uncomplicated preg and delivery Girl  Breast feeding   Subjective: no complaints, up ad lib, voiding and tolerating PO  Would like early discharge, baby having feeding difficulty, prefers breast feeding but may consider formula due to wt loss. If baby gets D/c this evening, she'd like discharge.   Objective: Blood pressure 117/69, pulse 96, temperature 98.8 F (37.1 C), temperature source Oral, resp. rate 18, height 5\' 9"  (1.753 m), weight 93.4 kg, SpO2 98 %, unknown if currently breastfeeding.  Physical Exam:  General: alert and cooperative Lochia: appropriate Uterine Fundus: firm Incision: n/a DVT Evaluation: No evidence of DVT seen on physical exam.  CBC Latest Ref Rng & Units 07/14/2019 07/13/2019  WBC 4.0 - 10.5 K/uL 10.0 8.7  Hemoglobin 12.0 - 15.0 g/dL 9.9(L) 11.3(L)  Hematocrit 36.0 - 46.0 % 29.6(L) 34.2(L)  Platelets 150 - 400 K/uL 209 206    Assessment/Plan: PPD #1, SVD, will be 24 hrs at 5 pm. If baby is allowed discharge, mother can have later discharge since experienced mom. . RN to call on call MD Dr Garwin Brothers if D/c desired, Teaching completed. Pt needs to call her Peds office and schedule appointment PP care and warning s/s reviewed Iron for mild anemia if tolerates well o/w continue PNvit with iron F/up Dr Ronita Hipps for [facility] The visit at 6 wks    LOS: 1 day   Elveria Royals 07/14/2019, 8:26 AM

## 2019-07-14 NOTE — Lactation Note (Signed)
This note was copied from a baby's chart. Lactation Consultation Note  Patient Name: Melissa Stark [id number] Date: 07/14/2019 Reason for consult: Follow-up assessment  P5 mother whose infant is now 92 hours old.  Mother has breast feeding experience and breast fed her second child the longest amount of time which was 15 months.  She pumped and bottle fed for her twins for one year.  Her desire is to breast feed this baby.  Baby was asleep in father's arms when I arrived.  Mother has been hand expressing and feeding back any EBM she obtains to baby.  Baby has only fed one time since delivery for approximately 10 minutes.  She remains very sleepy.    Parents are very interested in pursuing a discharge home tonight, even if it is later in the evening.  We discussed this option and what needs to happen for this to take place.  Due to baby's sleepiness I suggested techniques to awaken her within the next hour if she does not self awaken.  She last had drops about 2 hours ago.  Mother will attempt to latch her and call for assistance as needed.  She will hand express before/after latching and post pump for 15 minutes after latching.  If she is able to obtain colostrum drops, these drops will be finger fed back to baby.  Mother has a DEBP for home use.  RN updated.   Maternal Data    Feeding Feeding Type: Breast Milk  LATCH Score                   Interventions    Lactation Tools Discussed/Used     Consult Status Consult Status: Follow-up Date: 07/14/19 Follow-up type: In-patient    Annebelle Bostic R Shaquitta Burbridge 07/14/2019, 11:21 AM

## 2019-07-15 LAB — RH IG WORKUP (INCLUDES ABO/RH)
ABO/RH(D): O NEG
Fetal Screen: NEGATIVE
Gestational Age(Wks): 39
Unit division: 0

## 2019-07-15 MED ORDER — IBUPROFEN 600 MG PO TABS: 600.0000 mg | ORAL_TABLET | Freq: Four times a day (QID) | ORAL | 5 refills | Status: AC | PRN

## 2019-07-15 MED ORDER — MEASLES, MUMPS & RUBELLA VAC IJ SOLR
0.5000 mL | Freq: Once | INTRAMUSCULAR | Status: AC
  Administered 2019-07-15: 0.5 mL via SUBCUTANEOUS

## 2019-07-15 NOTE — Progress Notes (Signed)
PPD2 SVD:   S:  Pt reports feeling well/ Tolerating po/ Voiding without problems/ No n/v/ Bleeding is moderate/ Pain controlled withprescription NSAID's including motrin  Newborn info live female   O:  A & O x 3 / VS: Blood pressure 114/78, pulse 76, temperature 98.3 F (36.8 C), temperature source Oral, resp. rate 18, height 5\' 9"  (1.753 m), weight 93.4 kg, SpO2 99 %, unknown if currently breastfeeding.  LABS: No results found for this or any previous visit (from the past 24 hour(s)).  I&O: No intake/output data recorded.   No intake/output data recorded.  Lungs: Heart exam - S1, S2 normal, no murmur, no gallop, rate regular  Heart: regular rate and rhythm, S1, S2 normal, no murmur, click, rub or gallop  Abdomen: soft uterus firm at umb nontender  Perineum: is normal  Lochia: mod  Extremities:no redness or tenderness in the calves or thighs, no edema    A/P: PPD # 2/ [id number]  Doing well  Continue routine post partum orders  D/c home F/u 6 wk D/c instructions reviewed [facility] pp booklet given

## 2019-07-15 NOTE — Lactation Note (Signed)
This note was copied from a baby's chart. Lactation Consultation Note Baby 54 hrs old at time of consult. Mom very frustrated d/t can't get baby to latch. Mom is experienced BF mom. Mom asked LC to assess baby's tongue. Mom states baby is just chomping and can't extend her tongue under her nipple. Baby has mid anterior tight frenulum. Has limited mobility of the tongue. Thick labial frenulum. FOB has tongue tie. FOB told mom he didn't want the baby to get it corrected. That wasn't an option. When [place] entered rm. Mom had great positioning, mom latching baby onto breast, baby suckle a few times then stop and cry. Baby has high palate, mom has short shaft compressible nipples. Baby can latch but the baby isn't satisfied at the breast. Baby is frustrated as well. Mom has #24 NS. Applied NS. Taught application. Baby latched well and BF for 10 min. W/stimulation at intervals. No transfer noted in NS. Mom doesn't like NS. She stated she would use it for a little while but doesn't want to have to use it all the time. Mom stated she would just pump and bottle feed instead of using NS. Encouraged mom to wear shells. Mom states it is helpful wearing them and she has been wearing them. Mom states she is frustrated and wants her baby to eat and has felt that something was wrong. Mom thanks [facility] for looking and talking to her about it. Encouraged mom to pump every three hours and cont. To put the baby to the breast and try if she wants to.  LC told mom of options of feeding and supplementing. Mom didn't want to do anything or use any tools. Mom's plan is to pump every 3 hrs, formula feed until her milk comes in. Mom states she will try to put the baby on the breast some.   Offered using Donor milk instead of formula, mom refused. Will report to RN.  Patient Name: Melissa Stark [id number] Date: 07/15/2019 Reason for consult: Mother's request;Difficult latch;Term   Maternal Data    Feeding Feeding Type:  Formula Nipple Type: Slow - flow  LATCH Score Latch: Repeated attempts needed to sustain latch, nipple held in mouth throughout feeding, stimulation needed to elicit sucking reflex.  Audible Swallowing: None  Type of Nipple: Everted at rest and after stimulation  Comfort (Breast/Nipple): Soft / non-tender  Hold (Positioning): Full assist, staff holds infant at breast  LATCH Score: 5  Interventions Interventions: Breast feeding basics reviewed;Adjust position;DEBP;Assisted with latch;Support pillows;Skin to skin;Position options;Breast massage;Breast compression;Shells  Lactation Tools Discussed/Used Tools: Shells;Pump;Nipple Shields Nipple shield size: 24 Shell Type: Inverted Breast pump type: Double-Electric Breast Pump   Consult Status Consult Status: Follow-up Date: 07/15/19 Follow-up type: In-patient    Theodoro Kalata 07/15/2019, 12:24 AM

## 2019-07-15 NOTE — Discharge Summary (Signed)
Obstetric Discharge Summary Reason for Admission: induction of labor Prenatal Procedures: ultrasound Intrapartum Procedures: spontaneous vaginal delivery Postpartum Procedures: Rho(D) Ig Complications-Operative and Postpartum: none Hemoglobin  Date Value Ref Range Status  07/14/2019 9.9 (L) 12.0 - 15.0 g/dL Final   HCT  Date Value Ref Range Status  07/14/2019 29.6 (L) 36.0 - 46.0 % Final    Physical Exam:  General: alert, cooperative and no distress Lochia: appropriate Uterine Fundus: firm Incision: n/a DVT Evaluation: No evidence of DVT seen on physical exam.  Discharge Diagnoses: Term Pregnancy-delivered and Rh negative  Discharge Information: Date: 07/15/2019 Activity: pelvic rest Diet: routine Medications: PNV and Ibuprofen Condition: stable Instructions: refer to practice specific booklet Discharge to: home Follow-up Information    Brien Few, MD Follow up in 6 week(s).   Specialty: Obstetrics and Gynecology Contact information: [place] [address] [phone]        Brien Few, MD .   Specialty: Obstetrics and Gynecology Contact information: [address] [address] [phone]           Newborn Data: Live born female  Birth Weight: 7 lb 0.7 oz (3195 g) APGAR: 45, 9  Newborn Delivery   Birth date/time: 07/13/2019 17:05:00 Delivery type: Vaginal, Spontaneous      Home with mother.  Shauna Bodkins A Ronalda Walpole 07/15/2019, 9:58 AM

## 2019-07-15 NOTE — Lactation Note (Signed)
This note was copied from a baby's chart. Lactation Consultation Note  Patient Name: Melissa Stark [id number] Date: 07/15/2019   Baby 31 hours old and having difficulty latching.  Mother plans to pump and formula feed.  She will continue to attempt latching but per parents & previous [place] baby has short mid posterior frenulum.  Suggest OP appt if interested at a later time.  Family is not interested in [place] a revision but mother will continue to attempt latching with and without NS. Mother has personal DEBPs at home.  Discussed pumping frequency. Encouraged tummy time. Reviewed engorgement care and monitoring voids/stools.        Maternal Data    Feeding Feeding Type: Breast Milk  LATCH Score                   Interventions    Lactation Tools Discussed/Used     Consult Status      Carlye Grippe 07/15/2019, 8:19 AM

## 2019-07-15 NOTE — Discharge Instructions (Signed)
See [employer] ob/gyn booklet

## 2019-07-15 NOTE — Progress Notes (Signed)
AVS printed and discharge instructions given to pt. Patient informed to call for follow up appointment and to pick up  Prescription. Pt verbalized understanding and has no further questions.

## 2019-10-16 ENCOUNTER — Ambulatory Visit: Payer: No Typology Code available for payment source

## 2020-04-20 ENCOUNTER — Ambulatory Visit: Payer: No Typology Code available for payment source | Admitting: Internal Medicine

## 2020-04-25 ENCOUNTER — Encounter: Payer: Self-pay | Admitting: Internal Medicine

## 2020-04-25 ENCOUNTER — Other Ambulatory Visit: Payer: Self-pay

## 2020-04-25 ENCOUNTER — Ambulatory Visit (INDEPENDENT_AMBULATORY_CARE_PROVIDER_SITE_OTHER): Payer: No Typology Code available for payment source | Admitting: Internal Medicine

## 2020-04-25 VITALS — BP 132/92 | HR 88 | Ht 69.0 in | Wt 182.0 lb

## 2020-04-25 DIAGNOSIS — E890 Postprocedural hypothyroidism: Secondary | ICD-10-CM | POA: Insufficient documentation

## 2020-04-25 DIAGNOSIS — R002 Palpitations: Secondary | ICD-10-CM

## 2020-04-25 DIAGNOSIS — R079 Chest pain, unspecified: Secondary | ICD-10-CM

## 2020-04-25 DIAGNOSIS — Z9009 Acquired absence of other part of head and neck: Secondary | ICD-10-CM

## 2020-04-25 DIAGNOSIS — R072 Precordial pain: Secondary | ICD-10-CM

## 2020-04-25 DIAGNOSIS — F411 Generalized anxiety disorder: Secondary | ICD-10-CM | POA: Diagnosis not present

## 2020-04-25 NOTE — Assessment & Plan Note (Signed)
-   Patient experiencing high levels of anxiety.  - Encouraged patient to engage in relaxing activities like yoga, meditation, journaling, going for a walk, or participating in a hobby.  - Encouraged patient to reach out to trusted friends or family members about recent struggles 

## 2020-04-25 NOTE — Assessment & Plan Note (Signed)
Pt complain of pleuritic chest pain without any radiation to neck, shoulder, or arm. There is no chest pain on exertion. She doesn't smoke or drink. ECG doesn't show any changes. She does not complain of any heartburn. She had a hx of tachycardia during pregnancy. On PE, mid systolic click was noted without any murmur. There is no pleural or pericardial friction rub.

## 2020-04-25 NOTE — Progress Notes (Signed)
New Patient Office Visit  Subjective:  Subjective  Patient ID: Melissa BeechamLauren N Stark, female    DOB: 11/01/1985  Age: 34 y.o. MRN: [id number]  CC:  Chief Complaint  Patient presents with  . New Patient (Initial Visit)    patient here today with complaints of chest pain post covid vaccine , denies sob    HPI  Melissa Stark is a 34 y.o. female presenting today for a new patient evaluation.  She notes that she got her COVID19 vaccine, and she started getting chest pain a week later, mostly at night. She says it is happening right under her right breast and it has been waking her up at night. She grades it a 5/10. She states that she has been under a significant amount of stress dealing with her 5 children. She has taken ibuprofen and she wont hurt with that.   She saw Dr Elizabeth Sauereanna Jones on 05/05/2018 for chest pain lasting for >1 year. At that time, she described the pain as 3/10, waxing and waning, posterior right scapular. There was no other associated symptoms. Chest xray on 05/05/2018 for chest pain. There was no active cardiopulmonary disease.   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   [place]ologist [place] to make sure that the issues with her thyroid weren't affecting her heart.    Past Medical History:  Diagnosis Date  . 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 Dysrhythmia    hx of tachycardia  . GERD (gastroesophageal reflux disease)   . Low TSH level   . PP care, s/p SVD 12/24 08/04/2011    Past Surgical History:  Procedure Laterality Date  . THYROIDECTOMY, PARTIAL      Family History  Problem Relation Age of Onset  . Cancer Mother        melanoma  . Kidney disease Maternal Grandfather        chronic renal failure  . Diabetes Maternal Grandfather   . Heart disease Maternal Grandfather   . Hypertension Paternal Grandmother   . Peripheral vascular disease Paternal  Grandmother   . COPD Paternal Grandfather   . Diabetes Paternal Grandfather   . Hypertension Paternal Grandfather   . Heart disease Paternal Grandfather     Social History   Socioeconomic History  . Marital status: Married    Spouse name: Not on file  . Number of children: Not on file  . Years of education: Not on file  . Highest education level: Not on file  Occupational History  . Not on file  Tobacco Use  . Smoking status: Never Smoker  . Smokeless tobacco: Never Used  Vaping Use  . Vaping Use: Never used  Substance and Sexual Activity  . Alcohol use: No  . Drug use: No  . Sexual activity: Yes    Birth control/protection: None  Other Topics Concern  . Not on file  Social History Narrative  . Not on file   Social Determinants of Health   Financial Resource Strain:   . Difficulty of Paying Living Expenses: Not on file  Food Insecurity:   . Worried About Programme researcher, broadcasting/film/videounning Out of Food in the Last Year: Not on file  . Ran Out of Food in the Last Year: Not on file  Transportation Needs:   . Lack of Transportation (Medical): Not on file  . Lack of Transportation (Non-Medical): Not  on file  Physical Activity:   . Days of Exercise per Week: Not on file  . Minutes of Exercise per Session: Not on file  Stress:   . Feeling of Stress : Not on file  Social Connections:   . Frequency of Communication with Friends and Family: Not on file  . Frequency of Social Gatherings with Friends and Family: Not on file  . Attends Religious Services: Not on file  . Active Member of Clubs or Organizations: Not on file  . Attends Banker Meetings: Not on file  . Marital Status: Not on file  Intimate Partner Violence:   . Fear of Current or Ex-Partner: Not on file  . Emotionally Abused: Not on file  . Physically Abused: Not on file  . Sexually Abused: Not on file     Current Outpatient Medications:  .  ibuprofen (ADVIL) 600 MG tablet, Take 1 tablet (600 mg total) by mouth every 6  (six) hours as needed., Disp: 30 tablet, Rfl: 5   No Known Allergies  ROS Review of Systems  Constitutional: Negative.   HENT: Negative.   Eyes: Negative.   Respiratory: Positive for cough and shortness of breath.   Cardiovascular: Positive for chest pain and palpitations.  Gastrointestinal: Negative.   Endocrine: Negative.   Genitourinary: Negative.   Musculoskeletal: Negative.   Skin: Negative.   Allergic/Immunologic: Positive for environmental allergies.  Neurological: Negative.   Hematological: Negative.   Psychiatric/Behavioral: The patient is nervous/anxious.   All other systems reviewed and are negative.     Objective:    Physical Exam Vitals reviewed.  Constitutional:      Appearance: Normal appearance.  HENT:     Mouth/Throat:     Mouth: Mucous membranes are moist.  Eyes:     Pupils: Pupils are equal, round, and reactive to light.  Neck:     Vascular: No carotid bruit.  Cardiovascular:     Rate and Rhythm: Normal rate and regular rhythm.     Pulses: Normal pulses.     Heart sounds: Normal heart sounds.     Comments: Mid systolic click Pulmonary:     Effort: Pulmonary effort is normal.     Breath sounds: Normal breath sounds.  Abdominal:     General: Bowel sounds are normal.     Palpations: Abdomen is soft. There is no hepatomegaly, splenomegaly or mass.     Tenderness: There is no abdominal tenderness.     Hernia: No hernia is present.  Musculoskeletal:        General: No tenderness.     Cervical back: Neck supple.     Right lower leg: No edema.     Left lower leg: No edema.  Skin:    Findings: No rash.  Neurological:     Mental Status: She is alert and oriented to person, place, and time.     Motor: No weakness.  Psychiatric:        Mood and Affect: Mood and affect normal.        Behavior: Behavior normal.     BP (!) 132/92   Pulse 88   Ht 5\' 9"  (1.753 m)   Wt 182 lb (82.6 kg)   BMI 26.88 kg/m  Wt Readings from Last 3 Encounters:    04/25/20 182 lb (82.6 kg)  07/13/19 205 lb 12.8 oz (93.4 kg)  05/05/18 168 lb (76.2 kg)    Health Maintenance Due  Topic Date Due  . Hepatitis C Screening  Never done  . COVID-19 Vaccine (1) Never done  . PAP SMEAR-Modifier  09/12/2019  . INFLUENZA VACCINE  03/11/2020    There are no preventive care reminders to display for this patient.  Laboratory Data: I have reviewed this information for accuracy. CBC Latest Ref Rng & Units 07/14/2019 07/13/2019 02/23/2017  WBC 4.0 - 10.5 K/uL 10.0 8.7 8.9  Hemoglobin 12.0 - 15.0 g/dL 3.7(C) 11.3(L) 10.0(L)  Hematocrit 36 - 46 % 29.6(L) 34.2(L) 29.8(L)  Platelets 150 - 400 K/uL 209 206 180   CMP Latest Ref Rng & Units 05/05/2018 06/11/2016 04/28/2016  Glucose 65 - 99 mg/dL 88 94 93  BUN 6 - 20 mg/dL 13 9 -  Creatinine 5.88 - 1.00 mg/dL 5.02 7.74 -  Sodium 128 - 144 mmol/L 140 142 -  Potassium 3.5 - 5.2 mmol/L 4.1 4.2 -  Chloride 96 - 106 mmol/L 103 103 -  CO2 20 - 29 mmol/L 21 22 -  Calcium 8.7 - 10.2 mg/dL 9.3 8.8 -    Lab Results  Component Value Date   TSH 1.000 05/05/2018   Lab Results  Component Value Date   ALBUMIN 5.0 05/05/2018   Lab Results  Component Value Date   CHOL 179 05/05/2018   CHOL 192 04/28/2016   HDL 58 05/05/2018   HDL 61 04/28/2016   LDLCALC 109 (H) 05/05/2018   LDLCALC 117 (H) 04/28/2016   CHOLHDL 3.1 05/05/2018   CHOLHDL 3.1 04/28/2016   Lab Results  Component Value Date   TRIG 60 05/05/2018   Lab Results  Component Value Date   HGBA1C 5.3 05/05/2018      Assessment & Plan:   Problem List Items Addressed This Visit      Other   GENERALIZED ANXIETY DISORDER    - Patient experiencing high levels of anxiety.  - Encouraged patient to engage in relaxing activities like yoga, meditation, journaling, going for a walk, or participating in a hobby.  - Encouraged patient to reach out to trusted friends or family members about recent struggles       PALPITATIONS    Pt had palpitations during  pregnancy. Her physical exam is normal except for the mid systolic click. Chest is clear without rales or ronchi. There is no paricardial friction rub      Chest pain - Primary    Pt complain of pleuritic chest pain without any radiation to neck, shoulder, or arm. There is no chest pain on exertion. She doesn't smoke or drink. ECG doesn't show any changes. She does not complain of any heartburn. She had a hx of tachycardia during pregnancy. On PE, mid systolic click was noted without any murmur. There is no pleural or pericardial friction rub.       Relevant Orders   EKG 12-Lead   History of subtotal thyroidectomy    Pt had a mass about the size of a nickle on the right tyroid, which was removed surgically. She also had a biopsy of the left thyroid nodule. We'll check TSH and ask her to see endocrinology.          No orders of the defined types were placed in this encounter.   EKG: normal EKG, normal sinus rhythm.    Follow-up: No follow-ups on file.    Dr. Woodroe Chen [facility] [address] [place], [place] [address]   By signing my name below, I, [employer], attest that this documentation has been prepared under the direction and in  the presence of Corky Downs, MD. Electronically Signed: Corky Downs, MD 04/25/20, 1:00 PM   I personally performed the services described in this documentation, which was SCRIBED in my presence. The recorded information has been reviewed and considered accurate. It has been edited as necessary during review. Corky Downs, MD

## 2020-04-25 NOTE — Patient Instructions (Addendum)
Managing Anxiety, Adult  After being diagnosed with an anxiety disorder, you may be relieved to know why you have felt or behaved a certain way. You may also feel overwhelmed about the treatment ahead and what it will mean for your life. With care and support, you can manage this condition and recover from it.  How to manage lifestyle changes Managing stress and anxiety  Stress is your body's reaction to life changes and events, both good and bad. Most stress will last just a few hours, but stress can be ongoing and can lead to more than just stress. Although stress can play a major role in anxiety, it is not the same as anxiety. Stress is usually caused by something external, such as a deadline, test, or competition. Stress normally passes after the triggering event has ended.  Anxiety is caused by something internal, such as imagining a terrible outcome or worrying that something will go wrong that will devastate you. Anxiety often does not go away even after the triggering event is over, and it can become long-term (chronic) worry. It is important to understand the differences between stress and anxiety and to manage your stress effectively so that it does not lead to an anxious response. Talk with your health care provider or a counselor to learn more about reducing anxiety and stress. He or she may suggest tension reduction techniques, such as:  Music therapy. This can include creating or listening to music that you enjoy and that inspires you.  Mindfulness-based meditation. This involves being aware of your normal breaths while not trying to control your breathing. It can be done while sitting or walking.  Centering prayer. This involves focusing on a word, phrase, or sacred image that means something to you and brings you peace.  Deep breathing. To do this, expand your stomach and inhale slowly through your nose. Hold your breath for 3-5 seconds. Then exhale slowly, letting your stomach  muscles relax.  Self-talk. This involves identifying thought patterns that lead to anxiety reactions and changing those patterns.  Muscle relaxation. This involves tensing muscles and then relaxing them.  Choose a tension reduction technique that suits your lifestyle and personality. These techniques take time and practice. Set aside 5-15 minutes a day to do them. Therapists can offer counseling and training in these techniques. The training to help with anxiety may be covered by some insurance plans. Other things you can do to manage stress and anxiety include:  Keeping a stress/anxiety diary. This can help you learn what triggers your reaction and then learn ways to manage your response.  Thinking about how you react to certain situations. You may not be able to control everything, but you can control your response.  Making time for activities that help you relax and not feeling guilty about spending your time in this way.  Visual imagery and yoga can help you stay calm and relax.  Medicines Medicines can help ease symptoms. Medicines for anxiety include:  Anti-anxiety drugs.  Antidepressants. Medicines are often used as a primary treatment for anxiety disorder. Medicines will be prescribed by a health care provider. When used together, medicines, psychotherapy, and tension reduction techniques may be the most effective treatment.  Relationships Relationships can play a big part in helping you recover. Try to spend more time connecting with trusted friends and family members. Consider going to couples counseling, taking family education classes, or going to family therapy. Therapy can help you and others better understand your condition.  How  to recognize changes in your anxiety Everyone responds differently to treatment for anxiety. Recovery from anxiety happens when symptoms decrease and stop interfering with your daily activities at home or work. This may mean that you will start  to:  Have better concentration and focus. Worry will interfere less in your daily thinking.  Sleep better.  Be less irritable.  Have more energy.  Have improved memory. It is important to recognize when your condition is getting worse. Contact your health care provider if your symptoms interfere with home or work and you feel like your condition is not improving.  Follow these instructions at home: Activity 1. Exercise. Most adults should do the following: ? Exercise for at least 150 minutes each week. The exercise should increase your heart rate and make you sweat (moderate-intensity exercise). ? Strengthening exercises at least twice a week. 2. Get the right amount and quality of sleep. Most adults need 7-9 hours of sleep each night.  Lifestyle   Eat a healthy diet that includes plenty of vegetables, fruits, whole grains, low-fat dairy products, and lean protein. Do not eat a lot of foods that are high in solid fats, added sugars, or salt.  Make choices that simplify your life.  Do not use any products that contain nicotine or tobacco, such as cigarettes, e-cigarettes, and chewing tobacco. If you need help quitting, ask your health care provider.  Avoid caffeine, alcohol, and certain over-the-counter cold medicines. These may make you feel worse. Ask your pharmacist which medicines to avoid.  General instructions  Take over-the-counter and prescription medicines only as told by your health care provider.  Keep all follow-up visits as told by your health care provider. This is important.  Where to find support You can get help and support from these sources:  Self-help groups.  Online and [employer].  A trusted spiritual leader.  Couples counseling.  Family education classes.  Family therapy.  Where to find more information You may find that joining a support group helps you deal with your anxiety. The following sources can help you locate counselors  or support groups near you:  Mental Health America: www.mentalhealthamerica.net  Anxiety and Depression Association of [place] (ADAA): ProgramCam.de  The [employer] on Mental Illness (NAMI): www.nami.org  Contact a health care provider if you:  Have a hard time staying focused or finishing daily tasks.  Spend many hours a day feeling worried about everyday life.  Become exhausted by worry.  Start to have headaches, feel tense, or have nausea.  Urinate more than normal.  Have diarrhea.  Get help right away if you have:  A racing heart and shortness of breath.  Thoughts of hurting yourself or others.  If you ever feel like you may hurt yourself or others, or have thoughts about taking your own life, get help right away. You can go to your nearest emergency department or call:  Your local emergency services (911 in the [place]  A suicide crisis helpline, such as the National Suicide Prevention Lifeline at 316-448-5478. This is open 24 hours a day.  Summary  Taking steps to learn and use tension reduction techniques can help calm you and help prevent triggering an anxiety reaction.  When used together, medicines, psychotherapy, and tension reduction techniques may be the most effective treatment.  Family, friends, and partners can play a big part in helping you recover from an anxiety disorder.  This information is not intended to replace advice given to you by your health care provider.  Make sure you discuss any questions you have with your health care provider. Document Revised: 12/28/2018 Document Reviewed: 12/28/2018 Elsevier Patient Education  2020 [employer].   ---------------------------------------------------------------------------------------   Understanding How COVID-19 Vaccines Work Updated Jan 05, 2020  What You Need to Know . COVID-19 vaccines are safe and effective. . You may have side effects after vaccination, but these are normal. . It  typically takes two weeks after you are fully vaccinated for the body to build protection (immunity) against the virus that causes COVID-19. . If you are not vaccinated, find a vaccine. Keep taking all precautions until you are fully vaccinated. . If you are fully vaccinated, you can resume activities that you did prior to the pandemic. Learn more about what you can do when you have been fully vaccinated.  Vaccine sticker and vial The Immune System--the Body's Defense Against Infection To understand how COVID-19 vaccines work, it helps to first look at how our bodies fight illness. When germs, such as the virus that causes COVID-19, invade our bodies, they attack and multiply. This invasion, called an infection, is what causes illness. Our immune system uses several tools to fight infection. Blood contains red cells, which carry oxygen to tissues and organs, and white or immune cells, which fight infection. Different types of white blood cells fight infection in different ways:  Marland Kitchen Macrophages are white blood cells that swallow up and digest germs and dead or dying cells. The macrophages leave behind parts of the invading germs, called "antigens". The body identifies antigens as dangerous and stimulates antibodies to attack them. . B-lymphocytes are defensive white blood cells. They produce antibodies that attack the pieces of the virus left behind by the macrophages. . T-lymphocytes are another type of defensive white blood cell. They attack cells in the body that have already been infected. . The first time a person is infected with the virus that causes COVID-19, it can take several days or weeks for their body to make and use all the germ-fighting tools needed to get over the infection. After the infection, the person's immune system remembers what it learned about how to protect the body against that disease.  The body keeps a few T-lymphocytes, called "memory cells," that go into action quickly if  the body encounters the same virus again. When the familiar antigens are detected, B-lymphocytes produce antibodies to attack them. Experts are still learning how long these memory cells protect a person against the virus that causes COVID-19.  How COVID-19 Vaccines Work COVID-19 vaccines help our bodies develop immunity to the virus that causes COVID-19 without [place] having to get the illness.  COVID vaccine Different types of vaccines work in different ways to offer protection. But with all types of vaccines, the body is left with a supply of "memory" T-lymphocytes as well as B-lymphocytes that will remember how to fight that virus in the future.  It typically takes a few weeks after vaccination for the body to produce T-lymphocytes and B-lymphocytes. Therefore, it is possible that a person could be infected with the virus that causes COVID-19 just before or just after vaccination and then get sick because the vaccine did not have enough time to provide protection.  Sometimes after vaccination, the process of building immunity can cause symptoms, such as fever. These symptoms are normal and are signs that the body is building immunity.  Learn more about getting your vaccine.  Types of Vaccines Currently, there are three main types of COVID-19 vaccines that  are authorized and recommended or undergoing large-scale (Phase 3) clinical trials in the [place].  Below is a description of how each type of vaccine prompts our bodies to recognize and protect [place] from the virus that causes COVID-19. None of these vaccines can give you COVID-19.  Marland Kitchen mRNA vaccines contain material from the virus that causes COVID-19 that gives our cells instructions for how to make a harmless protein that is unique to the virus. After our cells make copies of the protein, they destroy the genetic material from the vaccine. Our bodies recognize that the protein should not be there and build T-lymphocytes and B-lymphocytes  that will remember how to fight the virus that causes COVID-19 if we are infected in the future. . Protein subunit vaccines include harmless pieces (proteins) of the virus that causes COVID-19 instead of the entire germ. Once vaccinated, our bodies recognize that the protein should not be there and build T-lymphocytes and antibodies that will remember how to fight the virus that causes COVID-19 if we are infected in the future. . Vector vaccines contain a modified version of a different virus than the one that causes COVID-19. Inside the shell of the modified virus, there is material from the virus that causes COVID-19. This is called a "viral vector." Once the viral vector is inside our cells, the genetic material gives cells instructions to make a protein that is unique to the virus that causes COVID-19. Using these instructions, our cells make copies of the protein. This prompts our bodies to build T-lymphocytes and B-lymphocytes that will remember how to fight that virus if we are infected in the future.  Some COVID-19 Vaccines Require More Than One Shot To be fully vaccinated, you will need two shots of some COVID-19 vaccines.  . Two shots: If you get a COVID-19 vaccine that requires two shots, you are considered fully vaccinated two weeks after your second shot. Pfizer-BioNTech and Moderna COVID-19 vaccines require two shots. . One Shot: If you get a COVID-19 vaccine that requires one shot, you are considered fully vaccinated two weeks after your shot. Johnson & Johnson's [employer] Dibbles COVID-19 vaccine only requires one shot. . If it has been less than two weeks since your shot, or if you still need to get your second shot, you are NOT fully protected. Keep taking steps to protect yourself and others until you are fully vaccinated (two weeks after your final shot).  Do I still need to get the COVID-19 vaccine if I was diagnosed with and recovered from COVID-19? Yes, you should be vaccinated regardless of  whether you already had COVID-19 because: . Research has not yet shown how long you are protected from getting COVID-19 again after you recover from COVID-19. . Vaccination helps protect you even if you've already had COVID-19. . Evidence is emerging that people get better protection by being fully vaccinated compared with having had COVID-19. One study showed that unvaccinated people who already had COVID-19 are more than 2 times as likely than fully vaccinated people to get COVID-19 again.  If you were treated for COVID-19 with monoclonal antibodies or convalescent plasma, you should wait 90 days before getting a COVID-19 vaccine. Talk to your doctor if you are unsure what treatments you received or if you have more questions about getting a COVID-19 vaccine.   Information from: [employer]no https://www.cdc.gov/coronavirus/2019-ncov/vaccines/faq.html#:~:text=No.,the%20criteria%20before%20getting%20vaccinated

## 2020-04-25 NOTE — Assessment & Plan Note (Signed)
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 She also had a biopsy of the left thyroid nodule. We'll check TSH and ask her to see endocrinology.

## 2020-04-25 NOTE — Assessment & Plan Note (Signed)
Pt had palpitations during pregnancy. Her physical exam is normal except for the mid systolic click. Chest is clear without rales or ronchi. There is no paricardial friction rub

## 2020-05-04 ENCOUNTER — Ambulatory Visit: Payer: No Typology Code available for payment source | Admitting: Internal Medicine

## 2020-06-25 ENCOUNTER — Encounter: Payer: Self-pay | Admitting: Internal Medicine

## 2020-06-25 ENCOUNTER — Ambulatory Visit (INDEPENDENT_AMBULATORY_CARE_PROVIDER_SITE_OTHER): Payer: No Typology Code available for payment source | Admitting: Internal Medicine

## 2020-06-25 ENCOUNTER — Other Ambulatory Visit: Payer: Self-pay

## 2020-06-25 VITALS — BP 122/80 | HR 86 | Ht 69.0 in | Wt 185.4 lb

## 2020-06-25 DIAGNOSIS — R002 Palpitations: Secondary | ICD-10-CM

## 2020-06-25 DIAGNOSIS — E890 Postprocedural hypothyroidism: Secondary | ICD-10-CM

## 2020-06-25 DIAGNOSIS — D508 Other iron deficiency anemias: Secondary | ICD-10-CM

## 2020-06-25 DIAGNOSIS — Z0000 Encounter for general adult medical examination without abnormal findings: Secondary | ICD-10-CM | POA: Diagnosis not present

## 2020-06-25 DIAGNOSIS — F411 Generalized anxiety disorder: Secondary | ICD-10-CM

## 2020-06-25 NOTE — Assessment & Plan Note (Signed)
Anxiety stable at the present time.  She is reading a book onstop worrying and start living

## 2020-06-25 NOTE — Assessment & Plan Note (Signed)
We will check thyroid function test also schedule her for ultrasound of the thyroid.

## 2020-06-25 NOTE — Assessment & Plan Note (Addendum)
We will get an echocardiogram to rule out cardiomyopathy and prolapse of the mitral valve.

## 2020-06-25 NOTE — Progress Notes (Signed)
Established Patient Office Visit  Subjective:  Patient ID: Melissa Stark, female    DOB: 11/16/1985  Age: 34 y.o. MRN: [id number]  CC:  Chief Complaint  Patient presents with  . Annual Exam    HPI  Melissa Stark presents for physical, patient is known to have anxiety neurosis anemia and history of subtotal thyroidectomy.  He also has a problem with reflux there is a family history of heart disease and hypertension she was also found to be anemic.  cholesterol 179 HDL is 58  Past Medical History:  Diagnosis Date  . 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 Dysrhythmia    hx of tachycardia  . GERD (gastroesophageal reflux disease)   . Low TSH level   . PP care, s/p SVD 12/24 08/04/2011    Past Surgical History:  Procedure Laterality Date  . THYROIDECTOMY, PARTIAL      Family History  Problem Relation Age of Onset  . Cancer Mother        melanoma  . Kidney disease Maternal Grandfather        chronic renal failure  . Diabetes Maternal Grandfather   . Heart disease Maternal Grandfather   . Hypertension Paternal Grandmother   . Peripheral vascular disease Paternal Grandmother   . COPD Paternal Grandfather   . Diabetes Paternal Grandfather   . Hypertension Paternal Grandfather   . Heart disease Paternal Grandfather     Social History   Socioeconomic History  . Marital status: Married    Spouse name: Not on file  . Number of children: Not on file  . Years of education: Not on file  . Highest education level: Not on file  Occupational History  . Not on file  Tobacco Use  . Smoking status: Never Smoker  . Smokeless tobacco: Never Used  Vaping Use  . Vaping Use: Never used  Substance and Sexual Activity  . Alcohol use: No  . Drug use: No  . Sexual activity: Yes    Birth control/protection: None  Other Topics Concern  . Not on file  Social History Narrative  . Not on file   Social Determinants of Health   Financial Resource Strain:   .  Difficulty of Paying Living Expenses: Not on file  Food Insecurity:   . Worried About Programme researcher, broadcasting/film/video in the Last Year: Not on file  . Ran Out of Food in the Last Year: Not on file  Transportation Needs:   . Lack of Transportation (Medical): Not on file  . Lack of Transportation (Non-Medical): Not on file  Physical Activity:   . Days of Exercise per Week: Not on file  . Minutes of Exercise per Session: Not on file  Stress:   . Feeling of Stress : Not on file  Social Connections:   . Frequency of Communication with Friends and Family: Not on file  . Frequency of Social Gatherings with Friends and Family: Not on file  . Attends Religious Services: Not on file  . Active Member of Clubs or Organizations: Not on file  . Attends Banker Meetings: Not on file  . Marital Status: Not on file  Intimate Partner Violence:   . Fear of Current or Ex-Partner: Not on file  . Emotionally Abused: Not on file  . Physically Abused: Not on file  . Sexually Abused: Not on file     Current Outpatient Medications:  .  ibuprofen (ADVIL) 600 MG tablet, Take 1 tablet (  600 mg total) by mouth every 6 (six) hours as needed., Disp: 30 tablet, Rfl: 5   No Known Allergies  ROS Review of Systems  Constitutional: Negative.   HENT: Negative.   Eyes: Negative.   Respiratory: Negative.   Cardiovascular: Negative.   Gastrointestinal: Negative.   Endocrine: Negative.   Genitourinary: Negative for dysuria.  Musculoskeletal: Positive for gait problem.  Skin: Negative.   Allergic/Immunologic: Negative.   Neurological: Negative for seizures and speech difficulty.  Hematological: Negative.   Psychiatric/Behavioral: Negative.  Negative for dysphoric mood.  All other systems reviewed and are negative.     Objective:    Physical Exam Vitals reviewed.  Constitutional:      Appearance: Normal appearance.  HENT:     Mouth/Throat:     Mouth: Mucous membranes are moist.  Eyes:     Pupils:  Pupils are equal, round, and reactive to light.  Neck:     Vascular: No carotid bruit.      Comments: Left thyroid nodule is present.  Patient has a scar of previous thyroid surgery Cardiovascular:     Rate and Rhythm: Normal rate and regular rhythm.     Pulses:          Dorsalis pedis pulses are 1+ on the right side and 1+ on the left side.       Posterior tibial pulses are 1+ on the right side and 1+ on the left side.     Heart sounds: S1 normal and S2 normal. No murmur heard.  No systolic murmur is present.      Comments: Midsystolic click is present Pulmonary:     Effort: Pulmonary effort is normal.     Breath sounds: Normal breath sounds.  Abdominal:     General: Bowel sounds are normal.     Palpations: Abdomen is soft. There is no hepatomegaly, splenomegaly or mass.     Tenderness: There is no abdominal tenderness.     Hernia: No hernia is present.  Musculoskeletal:        General: No tenderness.     Cervical back: Neck supple.     Right lower leg: No edema.     Left lower leg: No edema.  Skin:    Findings: No rash.  Neurological:     Mental Status: She is alert and oriented to person, place, and time.     Motor: No weakness.  Psychiatric:        Mood and Affect: Mood and affect normal.        Behavior: Behavior normal.     BP 122/80   Pulse 86   Ht 5\' 9"  (1.753 m)   Wt 185 lb 6.4 oz (84.1 kg)   BMI 27.38 kg/m  Wt Readings from Last 3 Encounters:  06/25/20 185 lb 6.4 oz (84.1 kg)  04/25/20 182 lb (82.6 kg)  07/13/19 205 lb 12.8 oz (93.4 kg)     Health Maintenance Due  Topic Date Due  . Hepatitis C Screening  Never done  . COVID-19 Vaccine (1) Never done  . PAP SMEAR-Modifier  09/12/2019  . INFLUENZA VACCINE  03/11/2020    There are no preventive care reminders to display for this patient.  Lab Results  Component Value Date   TSH 1.000 05/05/2018   Lab Results  Component Value Date   WBC 10.0 07/14/2019   HGB 9.9 (L) 07/14/2019   HCT 29.6 (L)  07/14/2019   MCV 93.1 07/14/2019   PLT 209 07/14/2019  Lab Results  Component Value Date   NA 140 05/05/2018   K 4.1 05/05/2018   CO2 21 05/05/2018   GLUCOSE 88 05/05/2018   BUN 13 05/05/2018   CREATININE 0.73 05/05/2018   ALBUMIN 5.0 05/05/2018   CALCIUM 9.3 05/05/2018   Lab Results  Component Value Date   CHOL 179 05/05/2018   Lab Results  Component Value Date   HDL 58 05/05/2018   Lab Results  Component Value Date   LDLCALC 109 (H) 05/05/2018   Lab Results  Component Value Date   TRIG 60 05/05/2018   Lab Results  Component Value Date   CHOLHDL 3.1 05/05/2018   Lab Results  Component Value Date   HGBA1C 5.3 05/05/2018      Assessment & Plan:   Problem List Items Addressed This Visit      Other   GENERALIZED ANXIETY DISORDER - Primary    Anxiety stable at the present time.  She is reading a book onstop worrying and start living      PALPITATIONS    We will get an echocardiogram to rule out cardiomyopathy and prolapse of the mitral valve.       Annual physical exam    Patient physical examination is normal HEENT is is normal neck is supple there is a scar of the previous thyroid surgery in the lower neck.  She has 1 cm nodule felt in the left side of the neck and we will schedule an ultrasound for that she will also advised to see her ENT specialist.  She was referred to GI.      History of subtotal thyroidectomy    We will check thyroid function test also schedule her for ultrasound of the thyroid.      Relevant Orders   US THYROID    Other Visit Diagnoses    Other iron deficiency anemia          No orders of the defined types were placed in this encounter.  Will schedule an echocardiogram ultrasound of the thyroid T3-T4 TSH and rheumatoid arthritis factor she was also advised to see an ENT specialist and gi specialist Follow-up: No follow-ups on file.    Corky Downs, MD

## 2020-06-25 NOTE — Assessment & Plan Note (Addendum)
Patient physical examination is normal HEENT is is normal neck is supple there is a scar of the previous thyroid surgery in the lower neck.  She has 1 cm nodule felt in the left side of the neck and we will schedule an ultrasound for that she will also advised to see her ENT specialist.  She was referred to GI.

## 2020-07-02 ENCOUNTER — Other Ambulatory Visit: Payer: Self-pay

## 2020-07-02 ENCOUNTER — Ambulatory Visit (INDEPENDENT_AMBULATORY_CARE_PROVIDER_SITE_OTHER): Payer: No Typology Code available for payment source | Admitting: Internal Medicine

## 2020-07-02 DIAGNOSIS — Z0000 Encounter for general adult medical examination without abnormal findings: Secondary | ICD-10-CM

## 2020-07-02 DIAGNOSIS — E890 Postprocedural hypothyroidism: Secondary | ICD-10-CM

## 2020-07-03 ENCOUNTER — Other Ambulatory Visit: Payer: Self-pay

## 2020-07-03 ENCOUNTER — Ambulatory Visit
Admission: RE | Admit: 2020-07-03 | Discharge: 2020-07-03 | Disposition: A | Discharge status: Home / Self Care | Payer: No Typology Code available for payment source | Source: Ambulatory Visit | Attending: Internal Medicine | Admitting: Internal Medicine

## 2020-07-03 DIAGNOSIS — E890 Postprocedural hypothyroidism: Secondary | ICD-10-CM | POA: Diagnosis not present

## 2020-07-03 LAB — LIPID PANEL
Cholesterol: 181 mg/dL (ref <200)
HDL: 59 mg/dL (ref >=50)
LDL Cholesterol (Calc): 109 mg/dL (calc) — ABNORMAL HIGH
Non-HDL Cholesterol (Calc): 122 mg/dL (calc) (ref <130)
Total CHOL/HDL Ratio: 3.1 (calc) (ref <5.0)
Triglycerides: 51 mg/dL (ref <150)

## 2020-07-03 LAB — CBC WITH DIFFERENTIAL/PLATELET
Absolute Monocytes: 378 cells/uL (ref 200–950)
Basophils Absolute: 31 cells/uL (ref 0–200)
Basophils Relative: 0.7 %
Eosinophils Absolute: 101 cells/uL (ref 15–500)
Eosinophils Relative: 2.3 %
HCT: 39.9 % (ref 35.0–45.0)
Hemoglobin: 13.4 g/dL (ref 11.7–15.5)
Lymphs Abs: 1461 cells/uL (ref 850–3900)
MCH: 31.7 pg (ref 27.0–33.0)
MCHC: 33.6 g/dL (ref 32.0–36.0)
MCV: 94.3 fL (ref 80.0–100.0)
MPV: 11.7 fL (ref 7.5–12.5)
Monocytes Relative: 8.6 %
Neutro Abs: 2429 cells/uL (ref 1500–7800)
Neutrophils Relative %: 55.2 %
Platelets: 194 10*3/uL (ref 140–400)
RBC: 4.23 10*6/uL (ref 3.80–5.10)
RDW: 11.8 % (ref 11.0–15.0)
Total Lymphocyte: 33.2 %
WBC: 4.4 10*3/uL (ref 3.8–10.8)

## 2020-07-03 LAB — TSH: TSH: 1.09 mIU/L

## 2020-07-03 LAB — COMPLETE METABOLIC PANEL WITH GFR
AG Ratio: 1.6 (calc) (ref 1.0–2.5)
ALT: 13 U/L (ref 6–29)
AST: 15 U/L (ref 10–30)
Albumin: 4.4 g/dL (ref 3.6–5.1)
Alkaline phosphatase (APISO): 45 U/L (ref 31–125)
BUN: 9 mg/dL (ref 7–25)
CO2: 20 mmol/L (ref 20–32)
Calcium: 9.3 mg/dL (ref 8.6–10.2)
Chloride: 106 mmol/L (ref 98–110)
Creat: 0.68 mg/dL (ref 0.50–1.10)
GFR, Est African American: 131 mL/min/{1.73_m2} (ref >=60)
GFR, Est Non African American: 113 mL/min/{1.73_m2} (ref >=60)
Globulin: 2.8 g/dL (calc) (ref 1.9–3.7)
Glucose, Bld: 98 mg/dL (ref 65–99)
Potassium: 4.3 mmol/L (ref 3.5–5.3)
Sodium: 139 mmol/L (ref 135–146)
Total Bilirubin: 0.4 mg/dL (ref 0.2–1.2)
Total Protein: 7.2 g/dL (ref 6.1–8.1)

## 2020-07-03 LAB — T4: T4, Total: 7.1 ug/dL (ref 5.1–11.9)

## 2020-07-03 LAB — T3: T3, Total: 107 ng/dL (ref 76–181)

## 2020-07-03 LAB — SPECIMEN COMPROMISED

## 2020-07-09 ENCOUNTER — Other Ambulatory Visit: Payer: No Typology Code available for payment source

## 2020-07-11 ENCOUNTER — Other Ambulatory Visit: Payer: Self-pay

## 2020-07-11 ENCOUNTER — Other Ambulatory Visit (INDEPENDENT_AMBULATORY_CARE_PROVIDER_SITE_OTHER): Payer: No Typology Code available for payment source

## 2020-10-18 ENCOUNTER — Other Ambulatory Visit: Payer: Self-pay | Admitting: Obstetrics and Gynecology

## 2020-10-18 DIAGNOSIS — N644 Mastodynia: Secondary | ICD-10-CM

## 2020-10-22 ENCOUNTER — Ambulatory Visit: Payer: No Typology Code available for payment source | Admitting: Internal Medicine

## 2020-12-04 ENCOUNTER — Other Ambulatory Visit: Payer: Self-pay

## 2020-12-04 ENCOUNTER — Ambulatory Visit: Payer: No Typology Code available for payment source

## 2020-12-04 ENCOUNTER — Ambulatory Visit
Admission: RE | Admit: 2020-12-04 | Discharge: 2020-12-04 | Disposition: A | Discharge status: Home / Self Care | Payer: No Typology Code available for payment source | Source: Ambulatory Visit | Attending: Obstetrics and Gynecology | Admitting: Obstetrics and Gynecology

## 2020-12-04 DIAGNOSIS — N644 Mastodynia: Secondary | ICD-10-CM

## 2022-07-31 ENCOUNTER — Encounter: Payer: No Typology Code available for payment source | Admitting: Nurse Practitioner

## 2022-08-18 ENCOUNTER — Encounter: Payer: No Typology Code available for payment source | Admitting: Internal Medicine

## 2022-08-19 ENCOUNTER — Encounter: Payer: No Typology Code available for payment source | Admitting: Internal Medicine

## 2022-11-06 IMAGING — US US THYROID
1 series · 13 of 25 positions shown · non-contrast
Comparison: 05/14/2016

CLINICAL DATA: Prior ultrasound follow-up.

EXAM:
THYROID ULTRASOUND
TECHNIQUE: Ultrasound examination of the thyroid gland and adjacent soft
tissues was performed.

[Series 1: us thyroid · 13 of 49 slices shown]
[im 1/49]
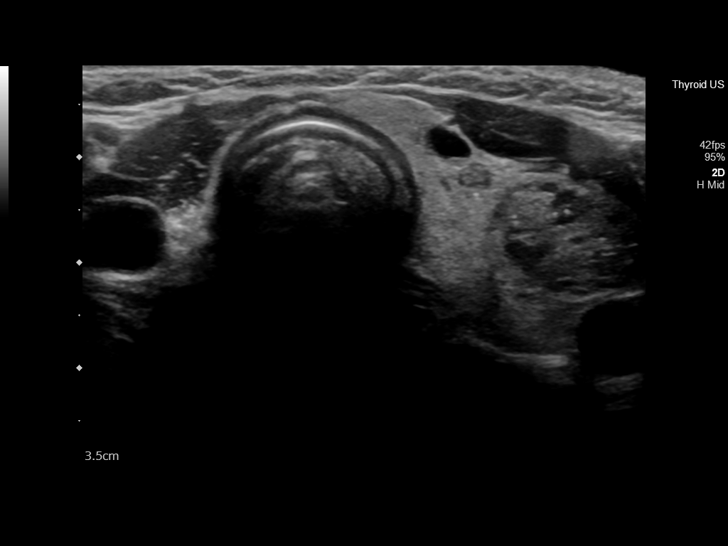
[im 5/49]
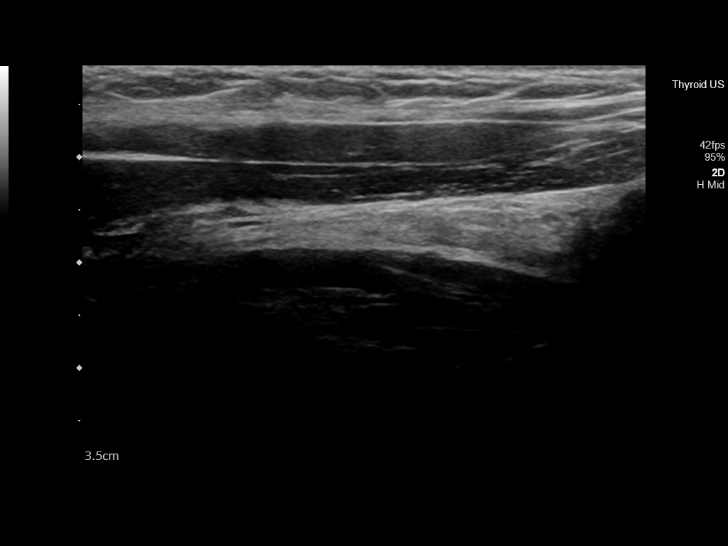
[im 9/49]
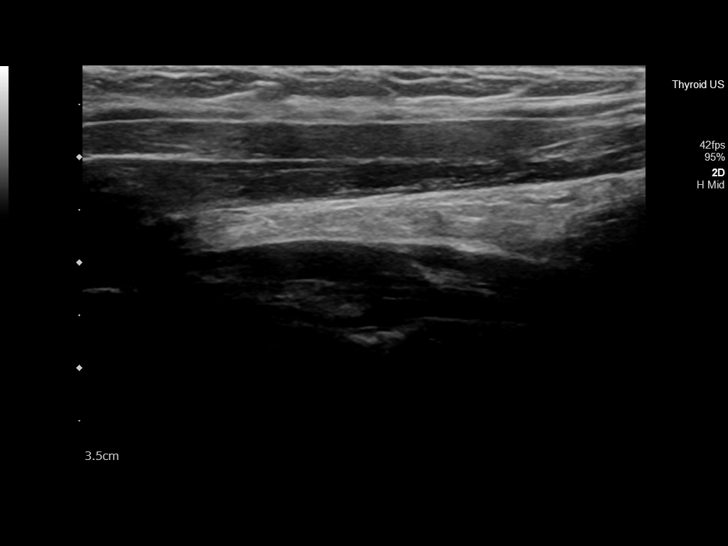
[im 13/49]
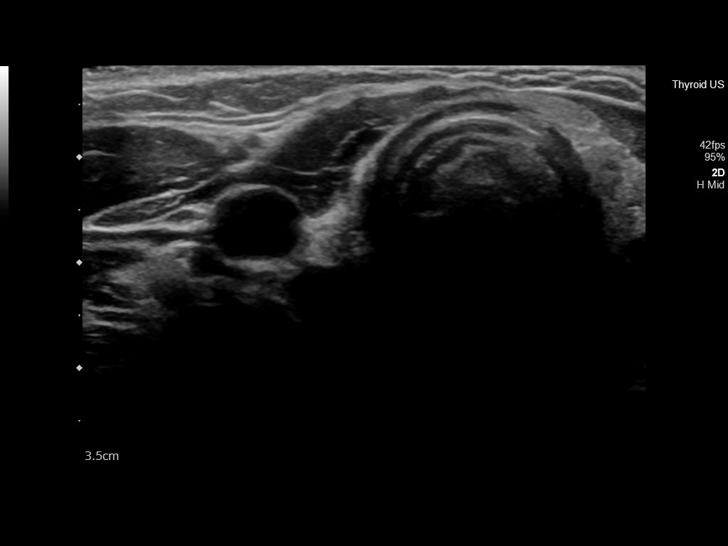
[im 17/49]
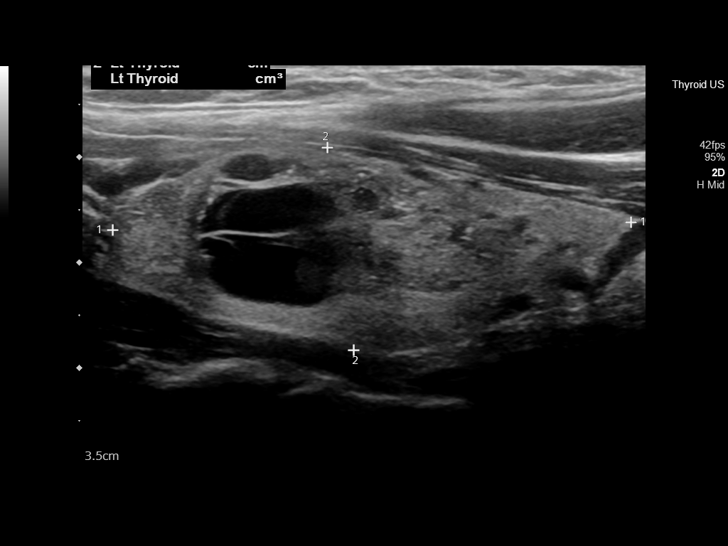
[im 21/49]
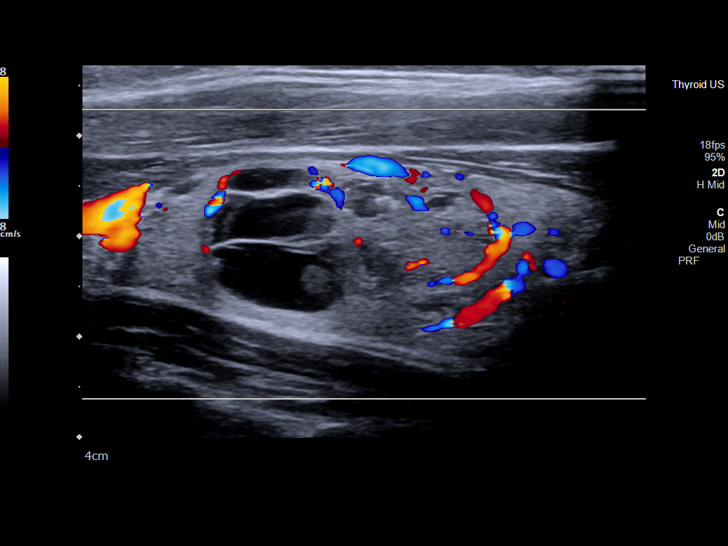
[im 25/49]
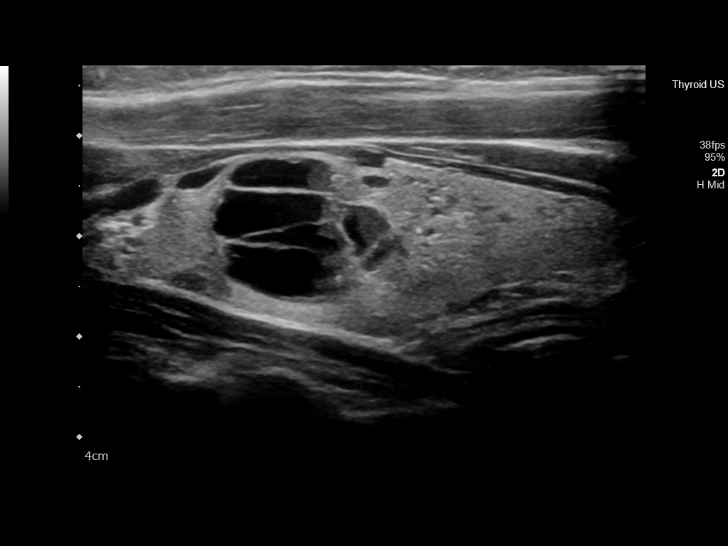
[im 29/49]
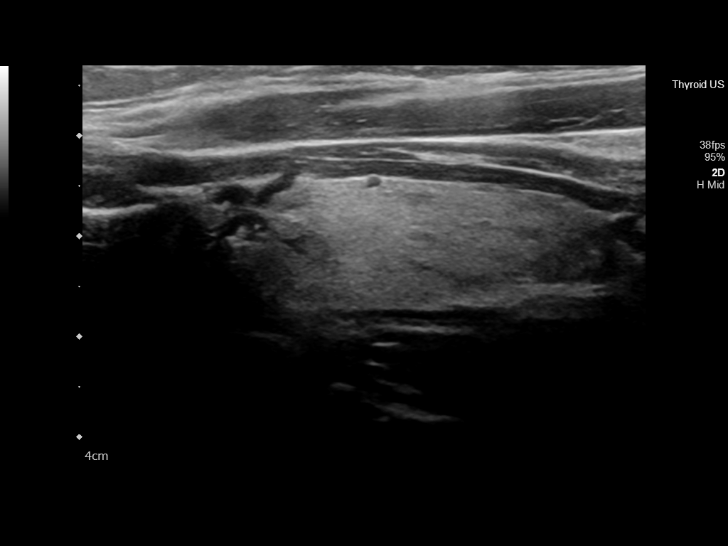
[im 33/49]
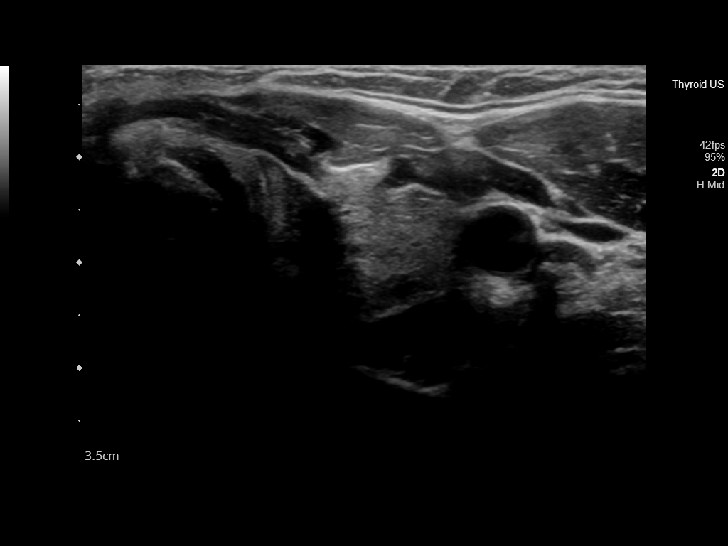
[im 37/49]
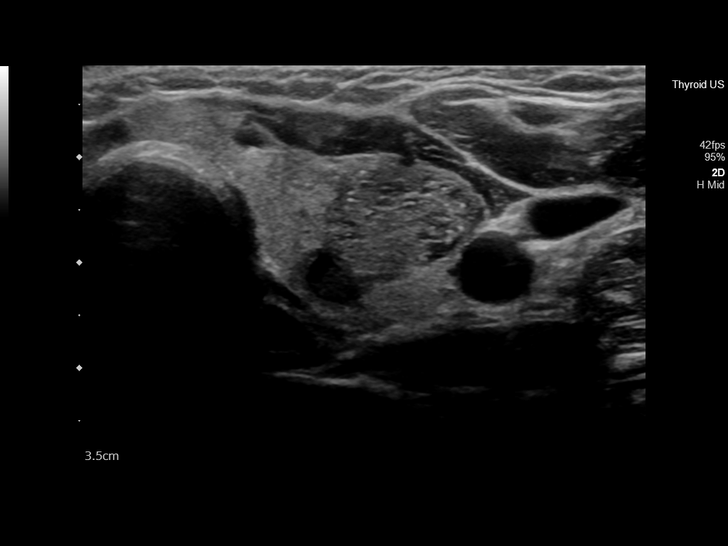
[im 41/49]
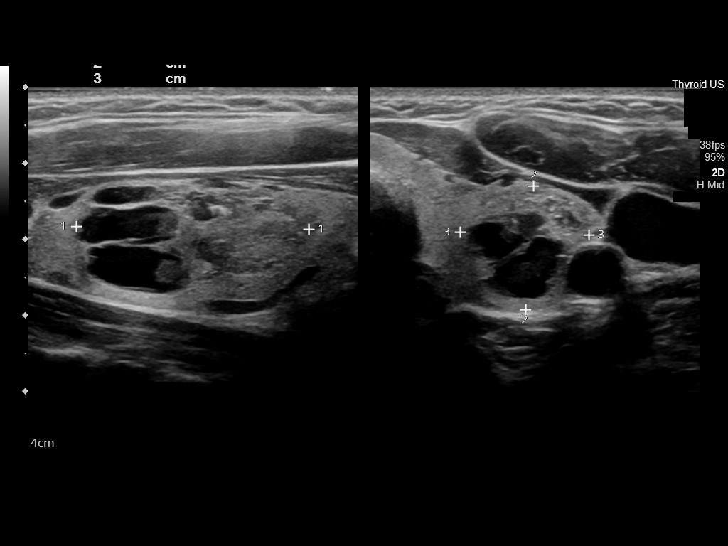
[im 45/49]
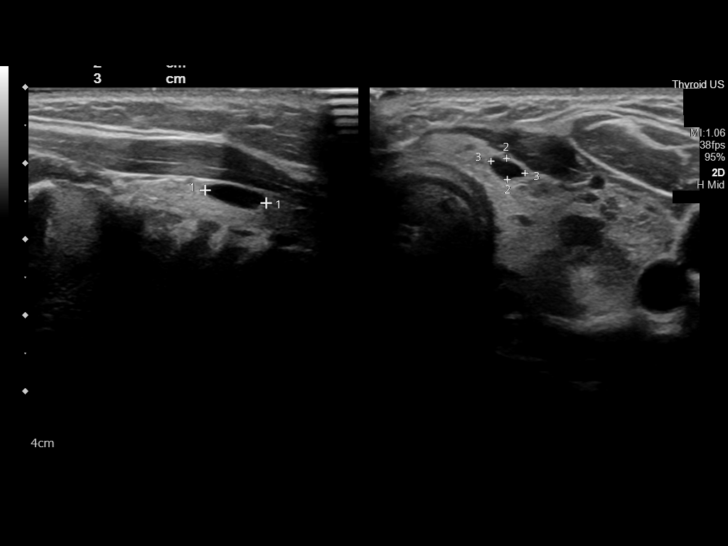
[im 49/49]
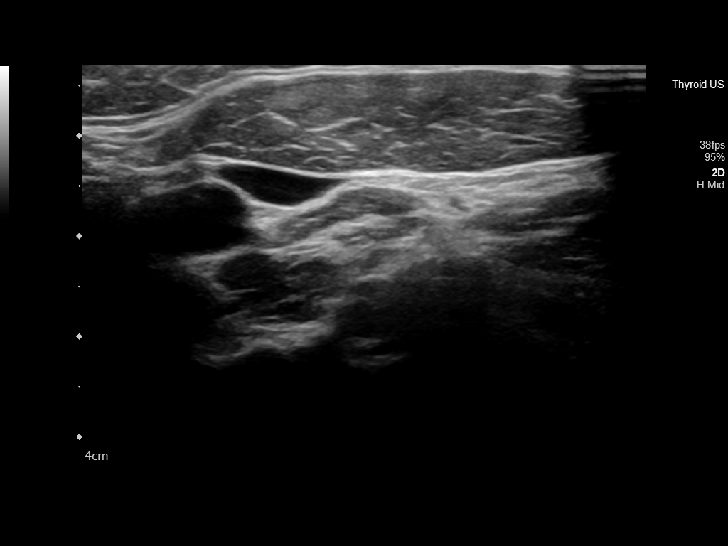

[13 of 25 positions shown; findings below may reference images not displayed]

FINDINGS: Parenchymal Echotexture: Mildly heterogenous

Isthmus: 0.4 cm previously 0.3 cm

Right lobe: Surgically absent. No soft tissue is appreciable in the
surgical bed.

Left lobe: 4.9 x 1.9 x 2.3 cm, previously 4.8 x 1.8 x 2.2 cm

_________________________________________________________

Estimated total number of nodules >/= 1 cm: 1

Number of spongiform nodules >/=  2 cm not described below (TR1): 0

Number of mixed cystic and solid nodules >/= 1.5 cm not described
below (TR2): 0

_________________________________________________________

Nodule # 1:

Prior biopsy: Yes

Location: Left; Mid

Maximum size: 3.1 cm; Other 2 dimensions: 1.6 x 1.7 cm, previously,
2.6 x 1 point by 1 5 cm

Composition: mixed cystic and solid (1)

Echogenicity: isoechoic (1)

Shape: not taller-than-wide (0)

Margins: ill-defined (0)

Echogenic foci: none (0)

ACR TI-RADS total points: 2.

ACR TI-RADS risk category:  TR2 (2 points).

Significant change in size (>/= 20% in two dimensions and minimal
increase of 2 mm): Yes

Change in features: Yes

Change in ACR TI-RADS risk category: Yes

ACR TI-RADS recommendations:

Recommend correlation with prior biopsy results.

_________________________________________________________

Similar appearing subcentimeter hypoechoic and cystic nodules in the
medial aspect of left thyroid, again characterized as benign.
IMPRESSION: 1. Interval partial cystic degradation with associated slight
enlargement however overall more benign appearance of the dominant
left thyroid nodule which was previously biopsied. Recommend
correlation with prior biopsy results.
2. Status post right hemithyroidectomy without evidence of local
recurrence.

The above is in keeping with the ACR TI-RADS recommendations - [HOSPITAL] 1465;[DATE].

## 2023-07-29 ENCOUNTER — Other Ambulatory Visit: Payer: Self-pay | Admitting: Obstetrics and Gynecology

## 2023-07-29 DIAGNOSIS — N6310 Unspecified lump in the right breast, unspecified quadrant: Secondary | ICD-10-CM

## 2023-09-02 ENCOUNTER — Other Ambulatory Visit: Payer: Self-pay | Admitting: Obstetrics and Gynecology

## 2023-09-02 ENCOUNTER — Ambulatory Visit
Admission: RE | Admit: 2023-09-02 | Discharge: 2023-09-02 | Disposition: A | Discharge status: Home / Self Care | Payer: No Typology Code available for payment source | Source: Ambulatory Visit | Attending: Obstetrics and Gynecology | Admitting: Obstetrics and Gynecology

## 2023-09-02 DIAGNOSIS — N6310 Unspecified lump in the right breast, unspecified quadrant: Secondary | ICD-10-CM

## 2023-09-02 DIAGNOSIS — N6489 Other specified disorders of breast: Secondary | ICD-10-CM

## 2023-09-07 ENCOUNTER — Ambulatory Visit
Admission: RE | Admit: 2023-09-07 | Discharge: 2023-09-07 | Disposition: A | Discharge status: Home / Self Care | Payer: No Typology Code available for payment source | Source: Ambulatory Visit | Attending: Obstetrics and Gynecology | Admitting: Obstetrics and Gynecology

## 2023-09-07 DIAGNOSIS — N6489 Other specified disorders of breast: Secondary | ICD-10-CM

## 2023-09-07 HISTORY — PX: BREAST BIOPSY: SHX20

## 2023-09-08 LAB — SURGICAL PATHOLOGY

## 2024-06-27 ENCOUNTER — Other Ambulatory Visit: Payer: Self-pay | Admitting: Obstetrics and Gynecology

## 2024-06-27 DIAGNOSIS — N6489 Other specified disorders of breast: Secondary | ICD-10-CM

## 2024-07-25 ENCOUNTER — Encounter

## 2024-08-02 ENCOUNTER — Encounter

## 2024-08-15 ENCOUNTER — Ambulatory Visit
Admission: RE | Admit: 2024-08-15 | Discharge: 2024-08-15 | Disposition: A | Discharge status: Home / Self Care | Source: Ambulatory Visit | Attending: Obstetrics and Gynecology | Admitting: Obstetrics and Gynecology

## 2024-08-15 ENCOUNTER — Other Ambulatory Visit: Payer: Self-pay | Admitting: Obstetrics and Gynecology

## 2024-08-15 ENCOUNTER — Ambulatory Visit

## 2024-08-15 ENCOUNTER — Encounter: Payer: Self-pay | Admitting: Obstetrics and Gynecology

## 2024-08-15 DIAGNOSIS — N6489 Other specified disorders of breast: Secondary | ICD-10-CM
